# Patient Record
Sex: Female | Born: 1960 | Race: Asian | Hispanic: No | Marital: Married | Smoking: Never smoker
Health system: Southern US, Community
[De-identification: ages and names within clinical notes are randomized; demographics above are authoritative.]

## PROBLEM LIST (undated history)

## (undated) DIAGNOSIS — I10 Essential (primary) hypertension: Secondary | ICD-10-CM

## (undated) HISTORY — PX: MULTIPLE TOOTH EXTRACTIONS: SHX2053

---

## 1998-01-15 ENCOUNTER — Encounter: Admission: RE | Admit: 1998-01-15 | Discharge: 1998-01-15 | Payer: Self-pay | Admitting: Internal Medicine

## 1999-12-24 ENCOUNTER — Encounter: Admission: RE | Admit: 1999-12-24 | Discharge: 1999-12-24 | Payer: Self-pay | Admitting: Hematology and Oncology

## 2000-01-21 ENCOUNTER — Ambulatory Visit (HOSPITAL_COMMUNITY): Admission: RE | Admit: 2000-01-21 | Discharge: 2000-01-21 | Payer: Self-pay

## 2000-03-08 ENCOUNTER — Encounter: Admission: RE | Admit: 2000-03-08 | Discharge: 2000-03-08 | Payer: Self-pay | Admitting: Internal Medicine

## 2000-03-25 ENCOUNTER — Encounter: Admission: RE | Admit: 2000-03-25 | Discharge: 2000-03-25 | Payer: Self-pay | Admitting: Obstetrics

## 2000-04-01 ENCOUNTER — Encounter: Admission: RE | Admit: 2000-04-01 | Discharge: 2000-04-01 | Payer: Self-pay | Admitting: Hematology and Oncology

## 2001-08-16 ENCOUNTER — Encounter: Admission: RE | Admit: 2001-08-16 | Discharge: 2001-08-16 | Payer: Self-pay

## 2001-08-18 ENCOUNTER — Ambulatory Visit (HOSPITAL_COMMUNITY): Admission: RE | Admit: 2001-08-18 | Discharge: 2001-08-18 | Payer: Self-pay | Admitting: *Deleted

## 2001-08-30 ENCOUNTER — Encounter: Admission: RE | Admit: 2001-08-30 | Discharge: 2001-08-30 | Payer: Self-pay

## 2001-10-24 ENCOUNTER — Encounter: Admission: RE | Admit: 2001-10-24 | Discharge: 2001-10-24 | Payer: Self-pay | Admitting: Internal Medicine

## 2001-10-26 ENCOUNTER — Ambulatory Visit (HOSPITAL_COMMUNITY): Admission: RE | Admit: 2001-10-26 | Discharge: 2001-10-26 | Payer: Self-pay

## 2001-10-31 ENCOUNTER — Encounter: Admission: RE | Admit: 2001-10-31 | Discharge: 2001-10-31 | Payer: Self-pay | Admitting: Internal Medicine

## 2001-11-07 ENCOUNTER — Encounter: Admission: RE | Admit: 2001-11-07 | Discharge: 2001-11-07 | Payer: Self-pay | Admitting: Internal Medicine

## 2003-07-10 ENCOUNTER — Encounter: Admission: RE | Admit: 2003-07-10 | Discharge: 2003-07-10 | Payer: Self-pay | Admitting: Specialist

## 2003-07-10 ENCOUNTER — Encounter: Payer: Self-pay | Admitting: Specialist

## 2003-09-07 ENCOUNTER — Emergency Department (HOSPITAL_COMMUNITY): Admission: EM | Admit: 2003-09-07 | Discharge: 2003-09-07 | Payer: Self-pay | Admitting: Emergency Medicine

## 2003-09-08 ENCOUNTER — Inpatient Hospital Stay (HOSPITAL_COMMUNITY): Admission: EM | Admit: 2003-09-08 | Discharge: 2003-09-11 | Payer: Self-pay | Admitting: Emergency Medicine

## 2003-09-10 ENCOUNTER — Encounter (INDEPENDENT_AMBULATORY_CARE_PROVIDER_SITE_OTHER): Payer: Self-pay | Admitting: *Deleted

## 2003-09-18 ENCOUNTER — Encounter: Admission: RE | Admit: 2003-09-18 | Discharge: 2003-09-18 | Payer: Self-pay | Admitting: Internal Medicine

## 2004-11-14 ENCOUNTER — Emergency Department (HOSPITAL_COMMUNITY): Admission: EM | Admit: 2004-11-14 | Discharge: 2004-11-14 | Payer: Self-pay | Admitting: Family Medicine

## 2005-01-29 ENCOUNTER — Ambulatory Visit: Payer: Self-pay | Admitting: Internal Medicine

## 2005-01-29 ENCOUNTER — Ambulatory Visit (HOSPITAL_COMMUNITY): Admission: RE | Admit: 2005-01-29 | Discharge: 2005-01-29 | Payer: Self-pay | Admitting: Internal Medicine

## 2005-04-22 ENCOUNTER — Emergency Department (HOSPITAL_COMMUNITY): Admission: EM | Admit: 2005-04-22 | Discharge: 2005-04-22 | Payer: Self-pay | Admitting: Family Medicine

## 2006-12-31 ENCOUNTER — Other Ambulatory Visit: Admission: RE | Admit: 2006-12-31 | Discharge: 2006-12-31 | Payer: Self-pay | Admitting: Gynecology

## 2006-12-31 ENCOUNTER — Encounter (INDEPENDENT_AMBULATORY_CARE_PROVIDER_SITE_OTHER): Payer: Self-pay | Admitting: *Deleted

## 2006-12-31 ENCOUNTER — Ambulatory Visit: Payer: Self-pay | Admitting: *Deleted

## 2007-01-14 ENCOUNTER — Ambulatory Visit: Payer: Self-pay | Admitting: Family Medicine

## 2007-11-02 ENCOUNTER — Emergency Department (HOSPITAL_COMMUNITY): Admission: EM | Admit: 2007-11-02 | Discharge: 2007-11-02 | Payer: Self-pay | Admitting: Emergency Medicine

## 2009-07-08 ENCOUNTER — Emergency Department (HOSPITAL_COMMUNITY): Admission: EM | Admit: 2009-07-08 | Discharge: 2009-07-09 | Payer: Self-pay | Admitting: Emergency Medicine

## 2011-02-13 NOTE — Discharge Summary (Signed)
NAMEJANINA, TRAFTON                              ACCOUNT NO.:  [id number]   MEDICAL RECORD NO.:  [id number]                   PATIENT TYPE:  INP   LOCATION:  3708                                 FACILITY:  [facility]   PHYSICIAN:  Ileana Roup, M.D.               DATE OF BIRTH:  Mar 22, 1961   DATE OF ADMISSION:  09/08/2003  DATE OF DISCHARGE:  09/11/2003                                 DISCHARGE SUMMARY   DISCHARGE DIAGNOSIS:  Acute pericarditis.   DISCHARGE MEDICATIONS:  1. INH 300 mg q.d. for 9 months.  2. B6 50 mg q.d. for 9 months.  3. Ibuprofen 800 mg t.i.d. for 5 days.   HOSPITAL FOLLOW UP:  December 21 at 3:15 p.m. in the [facility] outpatient  clinic.   PROCEDURE:  On September 10, 2003, 2-D echocardiogram summary:  Overall left  ventricular function systolic function was normal. Left ventricular ejection  fraction was estimated range being 55 to 65%. There were no left ventricular  regional wall motion abnormalities. 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 There was no pericardial  effusion. On September 08, 2003, chest CT with contrast, impression:  Nonspecific mild mediastinal adenopathy, minimal atelectasis in the lingula  and right lower lobe, no evidence of pulmonary embolism. CT scan of the  lower extremities with contrast:  No evidence of deep vein thrombosis. On  September 10, 2003 chest x-ray, impression:  Borderline cardiac size, mild  left lower lobe atelectasis.   HISTORY OF PRESENT ILLNESS:  A 50 year old [place] woman with no  significant past medical history reported to the emergency department with  sudden onset of retrosternal chest pain 10/10. She reports this was worsened  by breathing or movement, causes nausea, no vomiting, no fevers. The patient  was seen in the ED and sent home on Prevacid for presumed GERD. Chest pain  continued to worsen. The patient was unable to lie back. Chest pain now  radiates to the  back. She reports this is the first episode that she has  ever had of this. Therefore, she returned to the emergency department today  for further evaluation.   ADMISSION LABORATORY DATA:  Sodium 136, potassium 3.5, chloride 104, bicarb  23, BUN 7, creatinine 0.5, glucose 184. WBC 7.8, hemoglobin 14, platelets  322. D-dimer 0.44. CK-MB less than 10, myoglobin 32, troponin less than  0.05. EKG:  Normal sinus rhythm, normal axis, possible diffuse ST changes  throughout. Chest x-ray:  Negative.   HOSPITAL COURSE:  1. Acute pericarditis. The patient was admitted on September 08, 2003 for     further evaluation of chest pain. Cardiac enzymes were cycled and were     negative x3. Two-D echocardiogram was obtained two days after admission     and did not show pericardial effusion. At the time of the echocardiogram,     symptoms  had dissipated, and it is possible that the effusion has already     resolved. Presentation and EKG findings were more convincing for     pericarditis. The patient on admission was started on Motrin 800 mg     t.i.d. to continue this as an outpatient for 5 days. ANA was negative.     HIV was nonreactive. PPD was placed which did show reaction. The patient     had no arrhythmia on telemetry.  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 The patient     did have previous vaccine in [place]. Chest x-ray does not show     active disease. The patient was sent home on INH 300 mg for 9 months and     B6 50 mg for 9 months. The patient will be followed including her liver     enzymes.   DISCHARGE LABORATORY DATA:  WBC 5.9, hemoglobin 13, hematocrit 37.8,  platelets 371. Sodium 140, potassium 3.6, chloride 106, bicarb 28, glucose  91, BUN 8, creatinine 0.7, calcium 9.0. HIV nonreactive. UA negative. ANA  negative.      Delbert Harness, MD                    Ileana Roup, M.D.    Cleotis Lema  D:  10/23/2003  T:  10/23/2003  Job:  [id number]

## 2011-06-19 LAB — POCT URINALYSIS DIP (DEVICE)
Bilirubin Urine: NEGATIVE
Glucose, UA: NEGATIVE
Ketones, ur: NEGATIVE
Nitrite: NEGATIVE
Operator id: 239701
Protein, ur: NEGATIVE
Specific Gravity, Urine: 1.015
Urobilinogen, UA: 0.2
pH: 8.5 — ABNORMAL HIGH

## 2011-06-19 LAB — WET PREP, GENITAL
Trich, Wet Prep: NONE SEEN
Yeast Wet Prep HPF POC: NONE SEEN

## 2011-06-19 LAB — POCT PREGNANCY, URINE
Operator id: 239701
Preg Test, Ur: NEGATIVE

## 2011-06-19 LAB — URINE CULTURE
Colony Count: NO GROWTH
Culture: NO GROWTH

## 2011-06-19 LAB — GC/CHLAMYDIA PROBE AMP, GENITAL
Chlamydia, DNA Probe: NEGATIVE
GC Probe Amp, Genital: NEGATIVE

## 2011-11-04 ENCOUNTER — Ambulatory Visit (INDEPENDENT_AMBULATORY_CARE_PROVIDER_SITE_OTHER): Payer: Self-pay | Admitting: Internal Medicine

## 2011-11-04 ENCOUNTER — Encounter: Payer: Self-pay | Admitting: Internal Medicine

## 2011-11-04 DIAGNOSIS — M65839 Other synovitis and tenosynovitis, unspecified forearm: Secondary | ICD-10-CM

## 2011-11-04 DIAGNOSIS — M654 Radial styloid tenosynovitis [de Quervain]: Secondary | ICD-10-CM

## 2011-11-04 DIAGNOSIS — R209 Unspecified disturbances of skin sensation: Secondary | ICD-10-CM

## 2011-11-04 DIAGNOSIS — R030 Elevated blood-pressure reading, without diagnosis of hypertension: Secondary | ICD-10-CM

## 2011-11-04 DIAGNOSIS — Z0000 Encounter for general adult medical examination without abnormal findings: Secondary | ICD-10-CM

## 2011-11-04 DIAGNOSIS — R6889 Other general symptoms and signs: Secondary | ICD-10-CM

## 2011-11-04 DIAGNOSIS — H5712 Ocular pain, left eye: Secondary | ICD-10-CM

## 2011-11-04 DIAGNOSIS — H5710 Ocular pain, unspecified eye: Secondary | ICD-10-CM

## 2011-11-04 DIAGNOSIS — Z23 Encounter for immunization: Secondary | ICD-10-CM

## 2011-11-04 MED ORDER — IBUPROFEN 200 MG PO TABS: 200.0000 mg | ORAL_TABLET | Freq: Four times a day (QID) | ORAL | Status: AC | PRN

## 2011-11-04 NOTE — Patient Instructions (Signed)
1. Please take all medications as prescribed.  2. If you have worsening of your symptoms or new symptoms arise, please call the clinic ([phone]), or go to the ER immediately if symptoms are severe. 3.  
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
## 2011-11-25 ENCOUNTER — Encounter: Payer: Self-pay | Admitting: Obstetrics & Gynecology

## 2011-12-14 ENCOUNTER — Encounter: Payer: Self-pay | Admitting: Physician Assistant

## 2011-12-25 ENCOUNTER — Encounter: Payer: Self-pay | Admitting: Obstetrics & Gynecology

## 2012-01-06 ENCOUNTER — Ambulatory Visit (INDEPENDENT_AMBULATORY_CARE_PROVIDER_SITE_OTHER): Payer: Self-pay | Admitting: Obstetrics & Gynecology

## 2012-01-06 ENCOUNTER — Encounter: Payer: Self-pay | Admitting: Obstetrics & Gynecology

## 2012-01-06 VITALS — BP 144/89 | HR 64 | Temp 97.6°F | Resp 16 | Ht 61.0 in | Wt 122.9 lb

## 2012-01-06 DIAGNOSIS — N951 Menopausal and female climacteric states: Secondary | ICD-10-CM

## 2012-01-06 DIAGNOSIS — R87619 Unspecified abnormal cytological findings in specimens from cervix uteri: Secondary | ICD-10-CM

## 2012-01-06 DIAGNOSIS — Z1231 Encounter for screening mammogram for malignant neoplasm of breast: Secondary | ICD-10-CM

## 2012-01-06 NOTE — Progress Notes (Signed)
  Subjective:    Patient ID: Michele Webb, female    DOB: 04-03-1961, 51 y.o.   MRN: [id number]  HPIPatient's last menstrual period was 12/08/2011. [id number] The patient had an abnormal Pap smear followed by a colposcopy and biopsies in 2008. She has not had a Pap since then. The biopsy showed chronic inflammation with no dysplasia. Her doctor has sent her here for a Pap smear. She notes some occasional burning sensation in her right groin near her vulva. She has no abnormal discharge. She has regular menses that last that 2 days.  History reviewed. No pertinent past medical history. Past Surgical History  Procedure Date  . Multiple tooth extractions    No current outpatient prescriptions on file. No Known Allergies History   Social History  . Marital Status: Married    Spouse Name: N/A    Number of Children: N/A  . Years of Education: N/A   Occupational History  . Not on file.   Social History Main Topics  . Smoking status: Never Smoker   . Smokeless tobacco: Not on file  . Alcohol Use: No  . Drug Use: No  . Sexually Active: No   Other Topics Concern  . Not on file   Social History Narrative  . No narrative on file   Family History  Problem Relation Age of Onset  . Heart disease Mother   . Diabetes Mother   . Hypertension Mother   . Hyperlipidemia Mother   . Early death Father     Review of Systems As noted in HPI.     Objective:   Physical Exam  Constitutional: She is oriented to person, place, and time. She appears well-developed and well-nourished.  Abdominal: Soft. She exhibits no mass. There is no tenderness.  Genitourinary: Vagina normal and uterus normal.       No abnl discharge. Pap done. No masses. Vulva, groin normal  Neurological: She is alert and oriented to person, place, and time.  Skin: Skin is warm and dry.  Psychiatric: She has a normal mood and affect. Her behavior is normal.          Assessment & Plan:  History of abnormal pa, normal  biopsy 2008. Pap done. Recommend screening mammogram. Yearly exams. Alexandre Lightsey 3:56 PM 01/06/12

## 2012-01-06 NOTE — Patient Instructions (Signed)
Mammogram A mammogram is an X-ray test to find changes in a woman's breast. You should get a mammogram if:  You are over 53 years of age.   You have risk factors.   Your doctor recommends that you have one.  BEFORE THE TEST  Do not schedule the test the week before your period, especially if your breasts are sore during this time.  On the day of your mammogram:  Wash your breasts and armpits well. After washing, do not put on any deodorant or talcum powder on until after your test.   Eat and drink as you usually do.   Take your medicines as usual.   If you are diabetic and take insulin, make sure you:   Eat before coming for your test.   Take your insulin as usual.   If you cannot keep your appointment, call before the appointment to cancel. Schedule another appointment.  TEST  You will need to undress from the waist up. You will put on a hospital gown.   Your breast will be put on the mammogram machine, and it will press firmly on your breast with a piece of plastic called a compression paddle. This will make your breast flatter so that the machine can X-ray all parts of your breast.   Both breasts will be X-rayed. Each breast will be X-rayed from above and from the side. An X-ray might need to be taken again if the picture is not good enough.   The mammogram will last about 15 to 30 minutes.  AFTER THE TEST Finding out the results of your test Ask when your test results will be ready. Make sure you get your test results. Document Released: 12/11/2008 Document Revised: 09/03/2011 Document Reviewed: 12/11/2008 Exit[employer] Patient Information 2012 [employer]. 

## 2012-01-06 NOTE — Progress Notes (Signed)
Pt reports pain and burning in Rt groin x2 mos. She also had abnormal Pap in 2008, no Pap since then

## 2012-01-15 ENCOUNTER — Ambulatory Visit (HOSPITAL_COMMUNITY): Admission: RE | Admit: 2012-01-15 | Payer: Self-pay | Source: Ambulatory Visit

## 2012-02-08 ENCOUNTER — Ambulatory Visit (HOSPITAL_COMMUNITY): Payer: Self-pay

## 2012-03-03 ENCOUNTER — Ambulatory Visit (HOSPITAL_COMMUNITY)
Admission: RE | Admit: 2012-03-03 | Discharge: 2012-03-03 | Disposition: A | Discharge status: Home / Self Care | Payer: Self-pay | Source: Ambulatory Visit | Attending: Obstetrics & Gynecology | Admitting: Obstetrics & Gynecology

## 2012-03-03 DIAGNOSIS — Z1231 Encounter for screening mammogram for malignant neoplasm of breast: Secondary | ICD-10-CM | POA: Insufficient documentation

## 2012-04-21 ENCOUNTER — Encounter: Payer: Self-pay | Admitting: Internal Medicine

## 2012-04-26 ENCOUNTER — Encounter (HOSPITAL_COMMUNITY): Payer: Self-pay | Admitting: *Deleted

## 2012-04-26 ENCOUNTER — Emergency Department (HOSPITAL_COMMUNITY)
Admission: EM | Admit: 2012-04-26 | Discharge: 2012-04-26 | Disposition: A | Discharge status: Home Health | Payer: No Typology Code available for payment source | Attending: Emergency Medicine | Admitting: Emergency Medicine

## 2012-04-26 DIAGNOSIS — Y92410 Unspecified street and highway as the place of occurrence of the external cause: Secondary | ICD-10-CM | POA: Insufficient documentation

## 2012-04-26 DIAGNOSIS — T148XXA Other injury of unspecified body region, initial encounter: Secondary | ICD-10-CM

## 2012-04-26 DIAGNOSIS — IMO0002 Reserved for concepts with insufficient information to code with codable children: Secondary | ICD-10-CM | POA: Insufficient documentation

## 2012-04-26 DIAGNOSIS — Y998 Other external cause status: Secondary | ICD-10-CM | POA: Insufficient documentation

## 2012-04-26 DIAGNOSIS — Y93I9 Activity, other involving external motion: Secondary | ICD-10-CM | POA: Insufficient documentation

## 2012-04-26 MED ORDER — IBUPROFEN 400 MG PO TABS: 400.0000 mg | ORAL_TABLET | Freq: Once | ORAL | Status: DC

## 2012-04-26 MED ORDER — DIAZEPAM 5 MG PO TABS: 5.0000 mg | ORAL_TABLET | Freq: Three times a day (TID) | ORAL | Status: AC | PRN

## 2012-04-26 MED ORDER — IBUPROFEN 400 MG PO TABS
ORAL_TABLET | ORAL | Status: AC
  Filled 2012-04-26: qty 1

## 2012-04-26 MED ORDER — OXYCODONE-ACETAMINOPHEN 5-325 MG PO TABS
ORAL_TABLET | ORAL | Status: AC
  Filled 2012-04-26: qty 2

## 2012-04-26 MED ORDER — OXYCODONE-ACETAMINOPHEN 5-325 MG PO TABS
2.0000 | ORAL_TABLET | Freq: Once | ORAL | Status: AC
  Administered 2012-04-26: 2 via ORAL

## 2012-04-26 MED ORDER — OXYCODONE-ACETAMINOPHEN 5-325 MG PO TABS: 1.0000 | ORAL_TABLET | ORAL | Status: AC | PRN

## 2012-04-26 MED ORDER — NAPROXEN 500 MG PO TABS: 500.0000 mg | ORAL_TABLET | Freq: Two times a day (BID) | ORAL | Status: AC
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
## 2012-07-25 ENCOUNTER — Telehealth: Payer: Self-pay | Admitting: *Deleted

## 2012-07-25 NOTE — Telephone Encounter (Signed)
Pt called - her dentist Dr [place] Callas wants to talk to Dr Clyde Lundborg if she needs antibiotics for dental work due to hx of infection around heart. Phone number for dentist [phone]. Dental practice aware Dr Clyde Lundborg will be in clinic 07/26/12 PM.  Stanton Kidney Tharon Bomar RN 07/25/12 9:50AM

## 2012-08-09 ENCOUNTER — Ambulatory Visit (INDEPENDENT_AMBULATORY_CARE_PROVIDER_SITE_OTHER): Payer: Self-pay | Admitting: Internal Medicine

## 2012-08-09 ENCOUNTER — Encounter: Payer: Self-pay | Admitting: Internal Medicine

## 2012-08-09 VITALS — BP 140/80 | HR 66 | Temp 98.1°F | Ht 62.0 in | Wt 124.4 lb

## 2012-08-09 DIAGNOSIS — Z0000 Encounter for general adult medical examination without abnormal findings: Secondary | ICD-10-CM | POA: Insufficient documentation

## 2012-08-09 DIAGNOSIS — I10 Essential (primary) hypertension: Secondary | ICD-10-CM | POA: Insufficient documentation

## 2012-08-09 DIAGNOSIS — R42 Dizziness and giddiness: Secondary | ICD-10-CM | POA: Insufficient documentation

## 2012-08-09 MED ORDER — HYDROCHLOROTHIAZIDE 12.5 MG PO CAPS: 12.5000 mg | ORAL_CAPSULE | Freq: Every day | ORAL | Status: DC

## 2012-08-09 NOTE — Assessment & Plan Note (Signed)
Patient's blood pressure is slightly elevated recently. Today blood pressure is 153/95. She does not have chest pain, palpitation or leg edema. Will start treating patient with a low dose of hydrochlorothiazide, 12.5 mg daily. We'll reevaluate patient in one month.

## 2012-08-09 NOTE — Progress Notes (Signed)
Patient ID: Michele Webb, female   DOB: [birth year], 51 y.o.   MRN: [id number]  Subjective:   Patient ID: Michele Webb female   DOB: 1960/12/23 51 y.o.   MRN: [id number]  HPI: [name] is a 51 y.o. with past medical history as outlined below, who presents for an acute visit.  Patient reports that she has had mild dizziness for 2 weeks. She denies weakness or numbness in her extremities. There is no headache, chest pain or palpitation. She feels that "brain is not clear". She does not feel like the room is spinning around her. There is no ringing in her ears. Head position change does not worsen the dizziness. She has not had a fall. She does not lose her balance. Detailed questioning revealed that she has been sleeping together with her 64-year-old grandson recently. She could not have good sleep in the night. Most of the time she could only sleep 4 to 6 hours per night. She feels tired in the morning. There is no fever or chills  Patient is also concerned that she still has regular periods at age of 53. She said her sister stopped having periods at a year of 77. She reports that her period is regular with a normal volume. There is no weight loss. I explained to her that it is most likely normal variation. Patient was instructed to come back to the hospital if her periods become irregular or loose large amount of blood.   No past medical history on file. Current Outpatient Prescriptions  Medication Sig Dispense Refill  . hydrochlorothiazide (MICROZIDE) 12.5 MG capsule Take 1 capsule (12.5 mg total) by mouth daily.  30 capsule  3  . naproxen (NAPROSYN) 500 MG tablet Take 1 tablet (500 mg total) by mouth 2 (two) times daily.  30 tablet  0   Family History  Problem Relation Age of Onset  . Heart disease Mother   . Diabetes Mother   . Hypertension Mother   . Hyperlipidemia Mother   . Early death Father    History   Social History  . Marital Status: Married    Spouse Name: N/A    Number of  Children: N/A  . Years of Education: N/A   Social History Main Topics  . Smoking status: Never Smoker   . Smokeless tobacco: Not on file  . Alcohol Use: No  . Drug Use: No  . Sexually Active: No   Other Topics Concern  . Not on file   Social History Narrative  . No narrative on file   Review of Systems: Constitutional: Denies fever, chills, diaphoresis, appetite change and fatigue.  HEENT: Denies photophobia, eye pain, redness, hearing loss, ear pain, congestion, sore throat, rhinorrhea, sneezing, mouth sores, trouble swallowing, neck pain, neck stiffness and tinnitus.   Respiratory: Denies SOB, DOE, cough, chest tightness,  and wheezing.   Cardiovascular: Denies chest pain, palpitations and leg swelling.  Gastrointestinal: Denies nausea, vomiting, abdominal pain, diarrhea, constipation, blood in stool and abdominal distention.  Genitourinary: Denies dysuria, urgency, frequency, hematuria, flank pain and difficulty urinating.  Musculoskeletal: Denies myalgias, back pain, joint swelling, arthralgias and gait problem.  Skin: Denies pallor, rash and wound.  Neurological: Denies dizziness, seizures, syncope, weakness, light-headedness, numbness and headaches.  Hematological: Denies adenopathy. Easy bruising, personal or family bleeding history  Psychiatric/Behavioral: Denies suicidal ideation, mood changes, confusion, nervousness, sleep disturbance and agitation  Objective:  Physical Exam: Filed Vitals:   08/09/12 1625 08/09/12 1626 08/09/12 1627  BP: 153/95 120/80 140/80  Pulse: 66 66 66  Temp: 98.1 F (36.7 C)    TempSrc: Oral    Height: 5\' 2"  (1.575 m)    Weight: 124 lb 6.4 oz (56.427 kg)    SpO2: 100%     Constitutional: Vital signs reviewed.  Head: Normocephalic and atraumatic Ear: TM normal bilaterally Mouth: no erythema or exudates, MMM Eyes: PERRL, EOMI, conjunctivae normal, No scleral icterus.  Neck: Supple, Trachea midline normal ROM, No JVD, mass, thyromegaly, or  carotid bruit present.  Cardiovascular: RRR, S1 normal, S2 normal, no MRG, pulses symmetric and intact bilaterally Pulmonary/Chest: CTAB, no wheezes, rales, or rhonchi Abdominal: Soft. Non-tender, non-distended, bowel sounds are normal, no masses, organomegaly, or guarding present.  GU: no CVA tenderness Musculoskeletal: No joint deformities, erythema, or stiffness, ROM full. Hematology: no cervical, inginal, or axillary adenopathy.  Neurological: A&O x3, Strength is normal and symmetric bilaterally, cranial nerve II-XII are grossly intact, no focal motor deficit, sensory intact to light touch bilaterally.  Skin: Warm, dry and intact. No rash, cyanosis, or clubbing.  Psychiatric: Normal mood and affect. speech and behavior is normal. Judgment and thought content normal. Cognition and memory are normal.   Assessment & Plan:

## 2012-08-09 NOTE — Assessment & Plan Note (Signed)
Patient's dizziness is most likely due to sleep deprivation. Her neurologic examination is completely normal, it is unlikely that patient has serious problem, such as TIA or stroke. It is also less likely that patient has inner ear problem given no typical vestibular symptoms. Patient was advised to not sleep with her grandson together. She was instructed to come back to the hospital if her symptoms get worse.

## 2012-08-09 NOTE — Assessment & Plan Note (Signed)
-  Patient's mammogram and Pap smear are up-to-date -Patient refused flu shot today. -She would like to consider colonoscopy in next visit.

## 2012-08-09 NOTE — Patient Instructions (Signed)
1. your blood pressure is elevated, this means that you have mild hypertension. Please start taking hydrochlorothiazide from now. 2. Regarding her dizziness, I think is most likely because of your sleep deprivation. Please do not sleep together with your grandson from now. If your symptoms get worse please come back. 3. If you have worsening of your symptoms or new symptoms arise, please call the clinic ([phone]), or go to the ER immediately if symptoms are severe.    Hypertension As your heart beats, it forces blood through your arteries. This force is your blood pressure. If the pressure is too high, it is called hypertension (HTN) or high blood pressure. HTN is dangerous because you may have it and not know it. High blood pressure may mean that your heart has to work harder to pump blood. Your arteries may be narrow or stiff. The extra work puts you at risk for heart disease, stroke, and other problems.  Blood pressure consists of two numbers, a higher number over a lower, 110/72, for example. It is stated as "110 over 72." The ideal is below 120 for the top number (systolic) and under 80 for the bottom (diastolic). Write down your blood pressure today. You should pay close attention to your blood pressure if you have certain conditions such as:  Heart failure.  Prior heart attack.  Diabetes  Chronic kidney disease.  Prior stroke.  Multiple risk factors for heart disease. To see if you have HTN, your blood pressure should be measured while you are seated with your arm held at the level of the heart. It should be measured at least twice. A one-time elevated blood pressure reading (especially in the Emergency Department) does not mean that you need treatment. There may be conditions in which the blood pressure is different between your right and left arms. It is important to see your caregiver soon for a recheck. Most people have essential hypertension which means that there is not a  specific cause. This type of high blood pressure may be lowered by changing lifestyle factors such as:  Stress.  Smoking.  Lack of exercise.  Excessive weight.  Drug/tobacco/alcohol use.  Eating less salt. Most people do not have symptoms from high blood pressure until it has caused damage to the body. Effective treatment can often prevent, delay or reduce that damage. TREATMENT  When a cause has been identified, treatment for high blood pressure is directed at the cause. There are a large number of medications to treat HTN. These fall into several categories, and your caregiver will help you select the medicines that are best for you. Medications may have side effects. You should review side effects with your caregiver. If your blood pressure stays high after you have made lifestyle changes or started on medicines,   Your medication(s) may need to be changed.  Other problems may need to be addressed.  Be certain you understand your prescriptions, and know how and when to take your medicine.  Be sure to follow up with your caregiver within the time frame advised (usually within two weeks) to have your blood pressure rechecked and to review your medications.  If you are taking more than one medicine to lower your blood pressure, make sure you know how and at what times they should be taken. Taking two medicines at the same time can result in blood pressure that is too low. SEEK IMMEDIATE MEDICAL CARE IF:  You develop a severe headache, blurred or changing vision, or confusion.  You have unusual weakness or numbness, or a faint feeling.  You have severe chest or abdominal pain, vomiting, or breathing problems. MAKE SURE YOU:   Understand these instructions.  Will watch your condition.  Will get help right away if you are not doing well or get worse. Document Released: 09/14/2005 Document Revised: 12/07/2011 Document Reviewed: 05/04/2008 [facility] Patient Information 2013  [place], [place].

## 2013-05-23 ENCOUNTER — Encounter: Payer: Self-pay | Admitting: Internal Medicine

## 2013-10-25 ENCOUNTER — Encounter: Payer: Self-pay | Admitting: Internal Medicine

## 2014-07-30 ENCOUNTER — Encounter: Payer: Self-pay | Admitting: Internal Medicine

## 2015-04-20 ENCOUNTER — Encounter (HOSPITAL_COMMUNITY): Payer: Self-pay | Admitting: *Deleted

## 2015-04-20 DIAGNOSIS — R17 Unspecified jaundice: Secondary | ICD-10-CM | POA: Insufficient documentation

## 2015-04-20 DIAGNOSIS — K3580 Unspecified acute appendicitis: Principal | ICD-10-CM | POA: Insufficient documentation

## 2015-04-20 DIAGNOSIS — I10 Essential (primary) hypertension: Secondary | ICD-10-CM | POA: Insufficient documentation

## 2015-04-20 DIAGNOSIS — K8020 Calculus of gallbladder without cholecystitis without obstruction: Secondary | ICD-10-CM | POA: Insufficient documentation

## 2015-04-20 LAB — COMPREHENSIVE METABOLIC PANEL
ALT: 17 U/L (ref 14–54)
AST: 22 U/L (ref 15–41)
Albumin: 4.4 g/dL (ref 3.5–5.0)
Alkaline Phosphatase: 72 U/L (ref 38–126)
Anion gap: 11 (ref 5–15)
BUN: 12 mg/dL (ref 6–20)
CO2: 24 mmol/L (ref 22–32)
Calcium: 9.4 mg/dL (ref 8.9–10.3)
Chloride: 101 mmol/L (ref 101–111)
Creatinine, Ser: 0.6 mg/dL (ref 0.44–1.00)
GFR calc Af Amer: >60 mL/min (ref >60)
GFR calc non Af Amer: >60 mL/min (ref >60)
Glucose, Bld: 125 mg/dL — ABNORMAL HIGH (ref 65–99)
Potassium: 3.7 mmol/L (ref 3.5–5.1)
Sodium: 136 mmol/L (ref 135–145)
Total Bilirubin: 1.3 mg/dL — ABNORMAL HIGH (ref 0.3–1.2)
Total Protein: 7.7 g/dL (ref 6.5–8.1)

## 2015-04-20 LAB — URINALYSIS, ROUTINE W REFLEX MICROSCOPIC
Bilirubin Urine: NEGATIVE
Glucose, UA: NEGATIVE mg/dL
Ketones, ur: 40 mg/dL — AB
Nitrite: NEGATIVE
Protein, ur: NEGATIVE mg/dL
Specific Gravity, Urine: 1.024 (ref 1.005–1.030)
Urobilinogen, UA: 0.2 mg/dL (ref 0.0–1.0)
pH: 6 (ref 5.0–8.0)

## 2015-04-20 LAB — CBC
HCT: 39 % (ref 36.0–46.0)
Hemoglobin: 13.2 g/dL (ref 12.0–15.0)
MCH: 30.3 pg (ref 26.0–34.0)
MCHC: 33.8 g/dL (ref 30.0–36.0)
MCV: 89.4 fL (ref 78.0–100.0)
Platelets: 276 10*3/uL (ref 150–400)
RBC: 4.36 MIL/uL (ref 3.87–5.11)
RDW: 12.6 % (ref 11.5–15.5)
WBC: 6.1 10*3/uL (ref 4.0–10.5)

## 2015-04-20 LAB — LIPASE, BLOOD: Lipase: 20 U/L — ABNORMAL LOW (ref 22–51)

## 2015-04-20 LAB — URINE MICROSCOPIC-ADD ON

## 2015-04-20 MED ORDER — OXYCODONE-ACETAMINOPHEN 5-325 MG PO TABS
1.0000 | ORAL_TABLET | Freq: Once | ORAL | Status: AC
  Administered 2015-04-20: 1 via ORAL
  Filled 2015-04-20: qty 1

## 2015-04-20 MED ORDER — ONDANSETRON 4 MG PO TBDP
8.0000 mg | ORAL_TABLET | Freq: Once | ORAL | Status: AC
  Administered 2015-04-20: 8 mg via ORAL
  Filled 2015-04-20: qty 2

## 2015-04-20 NOTE — ED Notes (Signed)
The pts pain is increasing

## 2015-04-20 NOTE — ED Notes (Signed)
The pt is c/o abd pain since this am with nausea. lmp none

## 2015-04-21 ENCOUNTER — Observation Stay (HOSPITAL_COMMUNITY): Payer: Self-pay | Admitting: Anesthesiology

## 2015-04-21 ENCOUNTER — Observation Stay (HOSPITAL_COMMUNITY)
Admission: EM | Admit: 2015-04-21 | Discharge: 2015-04-22 | Disposition: A | Discharge status: Home / Self Care | Payer: Self-pay | Attending: General Surgery | Admitting: General Surgery

## 2015-04-21 ENCOUNTER — Encounter (HOSPITAL_COMMUNITY)
Admission: EM | Disposition: A | Discharge status: Home / Self Care | Payer: Self-pay | Source: Home / Self Care | Attending: Emergency Medicine

## 2015-04-21 ENCOUNTER — Encounter (HOSPITAL_COMMUNITY): Payer: Self-pay | Admitting: Radiology

## 2015-04-21 ENCOUNTER — Emergency Department (HOSPITAL_COMMUNITY): Payer: Self-pay

## 2015-04-21 DIAGNOSIS — K37 Unspecified appendicitis: Secondary | ICD-10-CM | POA: Diagnosis present

## 2015-04-21 HISTORY — PX: LAPAROSCOPIC APPENDECTOMY: SHX408

## 2015-04-21 HISTORY — DX: Essential (primary) hypertension: I10

## 2015-04-21 LAB — SURGICAL PCR SCREEN
MRSA, PCR: NEGATIVE
STAPHYLOCOCCUS AUREUS: NEGATIVE

## 2015-04-21 SURGERY — APPENDECTOMY, LAPAROSCOPIC
Anesthesia: General

## 2015-04-21 MED ORDER — MIDAZOLAM HCL 5 MG/5ML IJ SOLN
INTRAMUSCULAR | Status: DC | PRN
  Administered 2015-04-21: 2 mg via INTRAVENOUS

## 2015-04-21 MED ORDER — NEOSTIGMINE METHYLSULFATE 10 MG/10ML IV SOLN
INTRAVENOUS | Status: AC
  Filled 2015-04-21: qty 1

## 2015-04-21 MED ORDER — SODIUM CHLORIDE 0.9 % IR SOLN
Status: DC | PRN
  Administered 2015-04-21: 1000 mL

## 2015-04-21 MED ORDER — PROPOFOL 10 MG/ML IV BOLUS
INTRAVENOUS | Status: DC | PRN
  Administered 2015-04-21: 140 mg via INTRAVENOUS

## 2015-04-21 MED ORDER — BUPIVACAINE-EPINEPHRINE (PF) 0.25% -1:200000 IJ SOLN
INTRAMUSCULAR | Status: AC
  Filled 2015-04-21: qty 30

## 2015-04-21 MED ORDER — ALBUMIN HUMAN 5 % IV SOLN
INTRAVENOUS | Status: DC | PRN
  Administered 2015-04-21: 12:00:00 via INTRAVENOUS

## 2015-04-21 MED ORDER — LIDOCAINE HCL (CARDIAC) 20 MG/ML IV SOLN
INTRAVENOUS | Status: DC | PRN
  Administered 2015-04-21: 70 mg via INTRAVENOUS

## 2015-04-21 MED ORDER — FENTANYL CITRATE (PF) 250 MCG/5ML IJ SOLN
INTRAMUSCULAR | Status: AC
  Filled 2015-04-21: qty 5

## 2015-04-21 MED ORDER — OXYCODONE-ACETAMINOPHEN 5-325 MG PO TABS
1.0000 | ORAL_TABLET | ORAL | Status: DC | PRN
  Administered 2015-04-22: 2 via ORAL
  Filled 2015-04-21: qty 2

## 2015-04-21 MED ORDER — FENTANYL CITRATE (PF) 100 MCG/2ML IJ SOLN
INTRAMUSCULAR | Status: DC | PRN
  Administered 2015-04-21 (×2): 50 ug via INTRAVENOUS
  Administered 2015-04-21: 100 ug via INTRAVENOUS
  Administered 2015-04-21: 50 ug via INTRAVENOUS

## 2015-04-21 MED ORDER — ONDANSETRON HCL 4 MG/2ML IJ SOLN
4.0000 mg | Freq: Once | INTRAMUSCULAR | Status: AC
  Administered 2015-04-21: 4 mg via INTRAVENOUS
  Filled 2015-04-21: qty 2

## 2015-04-21 MED ORDER — MORPHINE SULFATE 4 MG/ML IJ SOLN
4.0000 mg | Freq: Once | INTRAMUSCULAR | Status: AC
  Administered 2015-04-21: 4 mg via INTRAVENOUS
  Filled 2015-04-21: qty 1

## 2015-04-21 MED ORDER — DEXTROSE 5 % IV SOLN
1.0000 g | Freq: Once | INTRAVENOUS | Status: AC
  Administered 2015-04-21: 1 g via INTRAVENOUS
  Filled 2015-04-21: qty 10

## 2015-04-21 MED ORDER — DEXAMETHASONE SODIUM PHOSPHATE 4 MG/ML IJ SOLN
INTRAMUSCULAR | Status: DC | PRN
  Administered 2015-04-21: 4 mg via INTRAVENOUS

## 2015-04-21 MED ORDER — SODIUM CHLORIDE 0.9 % IV SOLN
INTRAVENOUS | Status: DC
  Administered 2015-04-21: 03:00:00 via INTRAVENOUS

## 2015-04-21 MED ORDER — LIDOCAINE HCL (CARDIAC) 20 MG/ML IV SOLN
INTRAVENOUS | Status: AC
  Filled 2015-04-21: qty 15

## 2015-04-21 MED ORDER — HYDROMORPHONE HCL 1 MG/ML IJ SOLN
1.0000 mg | INTRAMUSCULAR | Status: DC | PRN
  Administered 2015-04-21: 1 mg via INTRAVENOUS
  Filled 2015-04-21: qty 1

## 2015-04-21 MED ORDER — NEOSTIGMINE METHYLSULFATE 10 MG/10ML IV SOLN
INTRAVENOUS | Status: DC | PRN
  Administered 2015-04-21: 5 mg via INTRAVENOUS

## 2015-04-21 MED ORDER — 0.9 % SODIUM CHLORIDE (POUR BTL) OPTIME
TOPICAL | Status: DC | PRN
  Administered 2015-04-21: 1000 mL

## 2015-04-21 MED ORDER — IOHEXOL 300 MG/ML  SOLN
25.0000 mL | Freq: Once | INTRAMUSCULAR | Status: AC | PRN
  Administered 2015-04-21: 25 mL via ORAL

## 2015-04-21 MED ORDER — ONDANSETRON HCL 4 MG/2ML IJ SOLN: 4.0000 mg | Freq: Four times a day (QID) | INTRAMUSCULAR | Status: DC | PRN

## 2015-04-21 MED ORDER — MIDAZOLAM HCL 2 MG/2ML IJ SOLN
INTRAMUSCULAR | Status: AC
  Filled 2015-04-21: qty 2

## 2015-04-21 MED ORDER — ONDANSETRON HCL 4 MG/2ML IJ SOLN
INTRAMUSCULAR | Status: AC
  Filled 2015-04-21: qty 2

## 2015-04-21 MED ORDER — LACTATED RINGERS IV SOLN
INTRAVENOUS | Status: DC | PRN
  Administered 2015-04-21 (×2): via INTRAVENOUS

## 2015-04-21 MED ORDER — ONDANSETRON HCL 4 MG/2ML IJ SOLN
INTRAMUSCULAR | Status: DC | PRN
  Administered 2015-04-21: 4 mg via INTRAVENOUS

## 2015-04-21 MED ORDER — PROPOFOL 10 MG/ML IV BOLUS
INTRAVENOUS | Status: AC
  Filled 2015-04-21: qty 20

## 2015-04-21 MED ORDER — ROCURONIUM BROMIDE 50 MG/5ML IV SOLN
INTRAVENOUS | Status: AC
  Filled 2015-04-21: qty 1

## 2015-04-21 MED ORDER — SUCCINYLCHOLINE CHLORIDE 20 MG/ML IJ SOLN
INTRAMUSCULAR | Status: AC
  Filled 2015-04-21: qty 1

## 2015-04-21 MED ORDER — HYDROMORPHONE HCL 1 MG/ML IJ SOLN: 0.2500 mg | INTRAMUSCULAR | Status: DC | PRN

## 2015-04-21 MED ORDER — SUCCINYLCHOLINE CHLORIDE 20 MG/ML IJ SOLN
INTRAMUSCULAR | Status: DC | PRN
  Administered 2015-04-21: 120 mg via INTRAVENOUS

## 2015-04-21 MED ORDER — SODIUM CHLORIDE 0.9 % IV SOLN: INTRAVENOUS | Status: DC

## 2015-04-21 MED ORDER — PROMETHAZINE HCL 25 MG/ML IJ SOLN: 6.2500 mg | INTRAMUSCULAR | Status: DC | PRN

## 2015-04-21 MED ORDER — ENOXAPARIN SODIUM 40 MG/0.4ML ~~LOC~~ SOLN: 40.0000 mg | Freq: Every day | SUBCUTANEOUS | Status: DC

## 2015-04-21 MED ORDER — GLYCOPYRROLATE 0.2 MG/ML IJ SOLN
INTRAMUSCULAR | Status: DC | PRN
  Administered 2015-04-21: 0.6 mg via INTRAVENOUS

## 2015-04-21 MED ORDER — BUPIVACAINE-EPINEPHRINE (PF) 0.5% -1:200000 IJ SOLN
INTRAMUSCULAR | Status: AC
  Filled 2015-04-21: qty 30

## 2015-04-21 MED ORDER — DEXAMETHASONE SODIUM PHOSPHATE 4 MG/ML IJ SOLN
INTRAMUSCULAR | Status: AC
  Filled 2015-04-21: qty 1

## 2015-04-21 MED ORDER — POTASSIUM CHLORIDE IN NACL 20-0.9 MEQ/L-% IV SOLN
INTRAVENOUS | Status: DC
Start: 2015-04-21 — End: 2015-04-22
  Administered 2015-04-21 – 2015-04-22 (×3): via INTRAVENOUS
  Filled 2015-04-21 (×3): qty 1000

## 2015-04-21 MED ORDER — BUPIVACAINE-EPINEPHRINE 0.25% -1:200000 IJ SOLN
INTRAMUSCULAR | Status: DC | PRN
  Administered 2015-04-21: 10 mL

## 2015-04-21 MED ORDER — IOHEXOL 300 MG/ML  SOLN
100.0000 mL | Freq: Once | INTRAMUSCULAR | Status: AC | PRN
  Administered 2015-04-21: 90 mL via INTRAVENOUS

## 2015-04-21 MED ORDER — GLYCOPYRROLATE 0.2 MG/ML IJ SOLN
INTRAMUSCULAR | Status: AC
  Filled 2015-04-21: qty 3

## 2015-04-21 MED ORDER — ARTIFICIAL TEARS OP OINT
TOPICAL_OINTMENT | OPHTHALMIC | Status: DC | PRN
  Administered 2015-04-21: 1 via OPHTHALMIC

## 2015-04-21 MED ORDER — ROCURONIUM BROMIDE 100 MG/10ML IV SOLN
INTRAVENOUS | Status: DC | PRN
  Administered 2015-04-21: 25 mg via INTRAVENOUS

## 2015-04-21 MED ORDER — ARTIFICIAL TEARS OP OINT
TOPICAL_OINTMENT | OPHTHALMIC | Status: AC
  Filled 2015-04-21: qty 3.5

## 2015-04-21 MED ORDER — METRONIDAZOLE IN NACL 5-0.79 MG/ML-% IV SOLN
500.0000 mg | Freq: Once | INTRAVENOUS | Status: AC
  Administered 2015-04-21: 500 mg via INTRAVENOUS
  Filled 2015-04-21: qty 100

## 2015-04-21 SURGICAL SUPPLY — 40 items
APPLIER CLIP ROT 10 11.4 M/L (STAPLE)
BENZOIN TINCTURE PRP APPL 2/3 (GAUZE/BANDAGES/DRESSINGS) ×3 IMPLANT
BLADE SURG ROTATE 9660 (MISCELLANEOUS) IMPLANT
CANISTER SUCTION 2500CC (MISCELLANEOUS) ×3 IMPLANT
CHLORAPREP W/TINT 26ML (MISCELLANEOUS) ×3 IMPLANT
CLIP APPLIE ROT 10 11.4 M/L (STAPLE) IMPLANT
CLOSURE WOUND 1/2 X4 (GAUZE/BANDAGES/DRESSINGS) ×1
COVER SURGICAL LIGHT HANDLE (MISCELLANEOUS) ×3 IMPLANT
CUTTER FLEX LINEAR 45M (STAPLE) ×3 IMPLANT
DRSG TEGADERM 2-3/8X2-3/4 SM (GAUZE/BANDAGES/DRESSINGS) ×6 IMPLANT
DRSG TEGADERM 4X4.75 (GAUZE/BANDAGES/DRESSINGS) ×3 IMPLANT
ELECT REM PT RETURN 9FT ADLT (ELECTROSURGICAL) ×3
ELECTRODE REM PT RTRN 9FT ADLT (ELECTROSURGICAL) ×1 IMPLANT
ENDOLOOP SUT PDS II  0 18 (SUTURE)
ENDOLOOP SUT PDS II 0 18 (SUTURE) IMPLANT
FILTER SMOKE EVAC LAPAROSHD (FILTER) ×3 IMPLANT
GAUZE SPONGE 2X2 8PLY STRL LF (GAUZE/BANDAGES/DRESSINGS) ×1 IMPLANT
GLOVE BIO SURGEON STRL SZ7 (GLOVE) ×3 IMPLANT
GLOVE BIOGEL PI IND STRL 7.5 (GLOVE) ×1 IMPLANT
GLOVE BIOGEL PI INDICATOR 7.5 (GLOVE) ×2
GOWN STRL REUS W/ TWL LRG LVL3 (GOWN DISPOSABLE) ×3 IMPLANT
GOWN STRL REUS W/TWL LRG LVL3 (GOWN DISPOSABLE) ×6
KIT BASIN OR (CUSTOM PROCEDURE TRAY) ×3 IMPLANT
KIT ROOM TURNOVER OR (KITS) ×3 IMPLANT
NS IRRIG 1000ML POUR BTL (IV SOLUTION) ×3 IMPLANT
PAD ARMBOARD 7.5X6 YLW CONV (MISCELLANEOUS) ×6 IMPLANT
POUCH SPECIMEN RETRIEVAL 10MM (ENDOMECHANICALS) ×3 IMPLANT
RELOAD STAPLE TA45 3.5 REG BLU (ENDOMECHANICALS) ×6 IMPLANT
SCALPEL HARMONIC ACE (MISCELLANEOUS) ×3 IMPLANT
SET IRRIG TUBING LAPAROSCOPIC (IRRIGATION / IRRIGATOR) ×3 IMPLANT
SPECIMEN JAR SMALL (MISCELLANEOUS) ×3 IMPLANT
SPONGE GAUZE 2X2 STER 10/PKG (GAUZE/BANDAGES/DRESSINGS) ×2
STRIP CLOSURE SKIN 1/2X4 (GAUZE/BANDAGES/DRESSINGS) ×2 IMPLANT
SUT MNCRL AB 4-0 PS2 18 (SUTURE) ×3 IMPLANT
TOWEL OR 17X24 6PK STRL BLUE (TOWEL DISPOSABLE) ×3 IMPLANT
TOWEL OR 17X26 10 PK STRL BLUE (TOWEL DISPOSABLE) ×3 IMPLANT
TRAY LAPAROSCOPIC MC (CUSTOM PROCEDURE TRAY) ×3 IMPLANT
TROCAR XCEL BLADELESS 5X75MML (TROCAR) ×6 IMPLANT
TROCAR XCEL BLUNT TIP 100MML (ENDOMECHANICALS) ×3 IMPLANT
TUBING INSUFFLATION (TUBING) ×3 IMPLANT

## 2015-04-21 NOTE — Anesthesia Preprocedure Evaluation (Addendum)
Anesthesia Evaluation  Patient identified by MRN, date of birth, ID band Patient awake    Reviewed: Allergy & Precautions, NPO status , Patient's Chart, lab work & pertinent test results  History of Anesthesia Complications Negative for: history of anesthetic complications  Airway Mallampati: II  TM Distance: >3 FB Neck ROM: Full    Dental  (+) Teeth Intact, Dental Advisory Given   Pulmonary neg pulmonary ROS,    Pulmonary exam normal       Cardiovascular hypertension, Normal cardiovascular exam    Neuro/Psych negative neurological ROS  negative psych ROS   GI/Hepatic Neg liver ROS,   Endo/Other  negative endocrine ROS  Renal/GU negative Renal ROS     Musculoskeletal   Abdominal   Peds  Hematology negative hematology ROS (+)   Anesthesia Other Findings   Reproductive/Obstetrics                            Anesthesia Physical Anesthesia Plan  ASA: II and emergent  Anesthesia Plan: General   Post-op Pain Management:    Induction: Intravenous, Rapid sequence and Cricoid pressure planned  Airway Management Planned: Oral ETT  Additional Equipment:   Intra-op Plan:   Post-operative Plan: Extubation in OR  Informed Consent: I have reviewed the patients History and Physical, chart, labs and discussed the procedure including the risks, benefits and alternatives for the proposed anesthesia with the patient or authorized representative who has indicated his/her understanding and acceptance.   Dental advisory given  Plan Discussed with: CRNA, Anesthesiologist and Surgeon  Anesthesia Plan Comments:        Anesthesia Quick Evaluation

## 2015-04-21 NOTE — Anesthesia Postprocedure Evaluation (Signed)
Anesthesia Post Note  Patient: Michele Webb  Procedure(s) Performed: Procedure(s) (LRB): APPENDECTOMY LAPAROSCOPIC (N/A)  Anesthesia type: general  Patient location: PACU  Post pain: Pain level controlled  Post assessment: Patient's Cardiovascular Status Stable  Last Vitals:  Filed Vitals:   04/21/15 1326  BP: 112/76  Pulse: 52  Temp: 36.6 C  Resp: 16    Post vital signs: Reviewed and stable  Level of consciousness: sedated  Complications: No apparent anesthesia complications

## 2015-04-21 NOTE — Progress Notes (Signed)
Report called to Misty Stanley, Charity fundraiser. Patient has left for surgery.

## 2015-04-21 NOTE — H&P (Signed)
Michele Webb is an 54 y.o. female.   Chief Complaint: Right lower quadrant abdominal pain HPI: This patient speaks little [place]. She is a coming in by her son. I was asked to see her after a CAT scan demonstrated appendicitis. She reports the onset of right lower quadrant abdominal pain since yesterday morning. The pain is described as sharp and moderate to severe. She has also had nausea and vomiting but no fever. After discussion with her son, she does seem to have a history of occasional right upper quadrant abdominal discomfort after meals but this is a separate discomfort. She is otherwise without complaints. Bowel movements have been normal  Past Medical History  Diagnosis Date  . Hypertension     Past Surgical History  Procedure Laterality Date  . Multiple tooth extractions      Family History  Problem Relation Age of Onset  . Heart disease Mother   . Diabetes Mother   . Hypertension Mother   . Hyperlipidemia Mother   . Early death Father    Social History:  reports that she has never smoked. She does not have any smokeless tobacco history on file. She reports that she does not drink alcohol or use illicit drugs.  Allergies:  Allergies  Allergen Reactions  . Benadryl [Diphenhydramine Hcl] Other (See Comments)    Couldn't sleep  . Other     Cough medications     (Not in a hospital admission)  Results for orders placed or performed during the hospital encounter of 04/21/15 (from the past 48 hour(s))  Urinalysis, Routine w reflex microscopic (not at [facility])     Status: Abnormal   Collection Time: 04/20/15  9:35 PM  Result Value Ref Range   Color, Urine YELLOW YELLOW   APPearance CLEAR CLEAR   Specific Gravity, Urine 1.024 1.005 - 1.030   pH 6.0 5.0 - 8.0   Glucose, UA NEGATIVE NEGATIVE mg/dL   Hgb urine dipstick MODERATE (A) NEGATIVE   Bilirubin Urine NEGATIVE NEGATIVE   Ketones, ur 40 (A) NEGATIVE mg/dL   Protein, ur NEGATIVE NEGATIVE mg/dL   Urobilinogen, UA 0.2  0.0 - 1.0 mg/dL   Nitrite NEGATIVE NEGATIVE   Leukocytes, UA SMALL (A) NEGATIVE  Urine microscopic-add on     Status: None   Collection Time: 04/20/15  9:35 PM  Result Value Ref Range   Squamous Epithelial / LPF RARE RARE   WBC, UA 3-6 <3 WBC/hpf   RBC / HPF 0-2 <3 RBC/hpf   Bacteria, UA RARE RARE  Lipase, blood     Status: Abnormal   Collection Time: 04/20/15  9:45 PM  Result Value Ref Range   Lipase 20 (L) 22 - 51 U/L  Comprehensive metabolic panel     Status: Abnormal   Collection Time: 04/20/15  9:45 PM  Result Value Ref Range   Sodium 136 135 - 145 mmol/L   Potassium 3.7 3.5 - 5.1 mmol/L   Chloride 101 101 - 111 mmol/L   CO2 24 22 - 32 mmol/L   Glucose, Bld 125 (H) 65 - 99 mg/dL   BUN 12 6 - 20 mg/dL   Creatinine, Ser 0.60 0.44 - 1.00 mg/dL   Calcium 9.4 8.9 - 10.3 mg/dL   Total Protein 7.7 6.5 - 8.1 g/dL   Albumin 4.4 3.5 - 5.0 g/dL   AST 22 15 - 41 U/L   ALT 17 14 - 54 U/L   Alkaline Phosphatase 72 38 - 126 U/L   Total Bilirubin 1.3 (  H) 0.3 - 1.2 mg/dL   GFR calc non Af Amer >60 >60 mL/min   GFR calc Af Amer >60 >60 mL/min    Comment: (NOTE) The eGFR has been calculated using the CKD EPI equation. This calculation has not been validated in all clinical situations. eGFR's persistently <60 mL/min signify possible Chronic Kidney Disease.    Anion gap 11 5 - 15  CBC     Status: None   Collection Time: 04/20/15  9:45 PM  Result Value Ref Range   WBC 6.1 4.0 - 10.5 K/uL   RBC 4.36 3.87 - 5.11 MIL/uL   Hemoglobin 13.2 12.0 - 15.0 g/dL   HCT 39.0 36.0 - 46.0 %   MCV 89.4 78.0 - 100.0 fL   MCH 30.3 26.0 - 34.0 pg   MCHC 33.8 30.0 - 36.0 g/dL   RDW 12.6 11.5 - 15.5 %   Platelets 276 150 - 400 K/uL   Ct Abdomen Pelvis W Contrast  04/21/2015   CLINICAL DATA:  Acute onset of right lower quadrant abdominal pain and vomiting. Initial encounter.  EXAM: CT ABDOMEN AND PELVIS WITH CONTRAST  TECHNIQUE: Multidetector CT imaging of the abdomen and pelvis was performed using  the standard protocol following bolus administration of intravenous contrast.  CONTRAST:  81m OMNIPAQUE IOHEXOL 300 MG/ML  SOLN  COMPARISON:  CT of the abdomen and pelvis performed 01/29/2005  FINDINGS: Minimal bibasilar atelectasis is noted.  Scattered hepatic cysts are seen, measuring up to 4.3 cm in size. The liver and spleen are otherwise unremarkable. Stones are seen at the base of the gallbladder; the gallbladder is otherwise unremarkable. There is dilatation of the common bile duct to 1.0 cm in size. This may be slightly more prominent than on the prior study, with suggestion of minimal intrahepatic biliary ductal prominence. Distal obstruction cannot be entirely excluded.  The gallbladder is within normal limits. The pancreas and adrenal glands are unremarkable.  The kidneys are unremarkable in appearance. There is no evidence of hydronephrosis. No renal or ureteral stones are seen. No perinephric stranding is appreciated.  No free fluid is identified. The small bowel is unremarkable in appearance. The stomach is within normal limits. No acute vascular abnormalities are seen.  The appendix is dilated to 1.9 cm in maximal diameter, with minimal associated soft tissue inflammation and multiple appendicoliths. The appendix is largely filled with fluid. There is no evidence of perforation or abscess formation at this time.  The colon is unremarkable in appearance.  The bladder is mildly distended and grossly remarkable. A small focus of enhancement within the uterus may reflect a tiny fibroids. The ovaries are relatively symmetric. No suspicious adnexal masses are seen. No inguinal lymphadenopathy is seen.  No acute osseous abnormalities are identified.  IMPRESSION: 1. Suspect acute appendicitis, with dilatation of the appendix to 1.9 cm in maximal diameter, minimal associated soft tissue inflammation and multiple appendicoliths. The appendix is largely filled with fluid. No evidence for perforation or abscess  formation at this time. 2. Dilatation of the common bile duct to 1.0 cm in size, slightly more prominent than on the prior study, with suggestion of minimal intrahepatic biliary ductal prominence. Cholelithiasis noted. Gallbladder otherwise unremarkable. Distal obstruction cannot be entirely excluded. Would consider MRCP for further evaluation, if the patient can hold her breath for the study. 3. Scattered hepatic cysts noted. 4. Question of tiny uterine fibroid. These results were called by telephone at the time of interpretation on 04/21/2015 at 5:07 am to Dr. KCyril Mourning  WARD, who verbally acknowledged these results.   Electronically Signed   By: Garald Balding M.D.   On: 04/21/2015 05:08    Review of Systems  All other systems reviewed and are negative.   Blood pressure 131/80, pulse 64, temperature 98.5 F (36.9 C), temperature source Oral, resp. rate 15, last menstrual period 03/01/2012, SpO2 95 %. Physical Exam  Constitutional: She is oriented to person, place, and time. She appears well-developed and well-nourished. No distress.  HENT:  Head: Normocephalic and atraumatic.  Right Ear: External ear normal.  Left Ear: External ear normal.  Nose: Nose normal.  Mouth/Throat: Oropharynx is clear and moist. No oropharyngeal exudate.  Eyes: Conjunctivae are normal. Pupils are equal, round, and reactive to light. No scleral icterus.  Neck: Normal range of motion. Neck supple. No tracheal deviation present.  Cardiovascular: Normal rate, regular rhythm, normal heart sounds and intact distal pulses.   No murmur heard. Respiratory: Effort normal and breath sounds normal. No respiratory distress.  GI: Soft. There is tenderness. There is guarding.  There is tenderness with guarding in the right lower quadrant. There is no right upper quadrant tenderness  Musculoskeletal: Normal range of motion. She exhibits no edema or tenderness.  Neurological: She is alert and oriented to person, place, and time.   Skin: Skin is warm and dry. No rash noted. She is not diaphoretic. No erythema.  Psychiatric: Her behavior is normal. Judgment normal.     Assessment/Plan Acute appendicitis  I have reviewed her CT scan. She does have gallstones also with dilation of her common bile duct and a slightly elevated bilirubin. She is currently not symptomatic from this but apparently has had symptoms consistent with biliary colic in the past. Her current problem is acute appendicitis. She is being admitted for IV anabiotic's and appendectomy today. I briefly discussed the surgical procedure with the patient through her son.  We may or may not be able to address the gallbladder this admission.  Zeshan Sena A 04/21/2015, 6:14 AM

## 2015-04-21 NOTE — ED Notes (Signed)
Pt ambulated to the bathroom and states that she vomited while in the bathroom.

## 2015-04-21 NOTE — Op Note (Signed)
Appendectomy, Lap, Procedure Note  Indications: The patient presented with a history of right-sided abdominal pain. A CT scan revealed findings consistent with acute appendicitis.  Pre-operative Diagnosis: Acute appendicitis without mention of peritonitis  Post-operative Diagnosis: Same  Surgeon: Nattie Lazenby K.   Assistants: none  Anesthesia: General endotracheal anesthesia  ASA Class: 2E Procedure Details  The patient was seen again in the Holding Room. The risks, benefits, complications, treatment options, and expected outcomes were discussed with the patient and/or family. The possibilities of reaction to medication, perforation of viscus, bleeding, recurrent infection, finding a normal appendix, the need for additional procedures, failure to diagnose a condition, and creating a complication requiring transfusion or operation were discussed. There was concurrence with the proposed plan and informed consent was obtained. The site of surgery was properly noted. The patient was taken to Operating Room, identified as Michele Webb and the procedure verified as Appendectomy. A Time Out was held and the above information confirmed.  The patient was placed in the supine position and general anesthesia was induced.  The abdomen was prepped and draped in a sterile fashion. A one centimeter infraumbilical incision was made.  Dissection was carried down to the fascia bluntly.  The fascia was incised vertically.  We entered the peritoneal cavity bluntly.  A pursestring suture was passed around the incision with a 0 Vicryl.  The Hasson cannula was introduced into the abdomen and the tails of the suture were used to hold the Hasson in place.   The pneumoperitoneum was then established maintaining a maximum pressure of 15 mmHg.  Additional 5 mm cannulas then placed in the left lower quadrant of the abdomen and the right upper quadrant under direct visualization. A careful evaluation of the entire abdomen was  carried out. The patient was placed in Trendelenburg and left lateral decubitus position.  The scope was moved to the right upper quadrant port site. The cecum was mobilized medially.  The appendix was quite swollen but no sign of perforation.  The appendix was carefully dissected. The appendix was skeletonized with the harmonic scalpel.   The appendix was divided at its base using an endo-GIA stapler. Minimal appendiceal stump was left in place. There was no evidence of bleeding, leakage, or complication after division of the appendix. Irrigation was also performed and irrigate suctioned from the abdomen as well.  The umbilical port site was closed with the purse string suture. There was no residual palpable fascial defect.  The trocar site skin wounds were closed with 4-0 Monocryl.  Instrument, sponge, and needle counts were correct at the conclusion of the case.   Findings: The appendix was found to be inflamed. There were not signs of necrosis.  There was not perforation. There was not abscess formation.  Estimated Blood Loss:  Minimal         Drains: none         Specimens: appendix         Complications:  None; patient tolerated the procedure well.         Disposition: PACU - hemodynamically stable.         Condition: stable  Wilmon Arms. Corliss Skains, MD, [facility]  General/ Trauma Surgery  04/21/2015 12:19 PM

## 2015-04-21 NOTE — ED Provider Notes (Signed)
This chart was scribed for  Layla Maw Ward, DO by Bethel Born, ED Scribe. This patient was seen in room A02C/A02C and the patient's care was started at 1:37 AM.  TIME SEEN: 1:37 AM   CHIEF COMPLAINT: abdominal pain  HPI: Michele Webb is a 54 y.o. female with no significant PMHx who presents to the Emergency Department complaining of new lower abdominal pain with onset this morning. The constant pain is rated 10/10 in severity at worst and described as aching. Associated vomiting. Pt denies fever, chills, diarrhea, chest pain, SOB, dysuria, hematuria, hematochezia, vaginal bleeding, and abnormal vaginal discharge. No previous abdominal surgeries. No PCP. No sick contacts or recent travel.   ROS: See HPI Constitutional: no fever  Eyes: no drainage  ENT: no runny nose   Cardiovascular:  no chest pain  Resp: no SOB  GI: vomiting GU: no dysuria Integumentary: no rash  Allergy: no hives  Musculoskeletal: no leg swelling  Neurological: no slurred speech ROS otherwise negative  PAST MEDICAL HISTORY/PAST SURGICAL HISTORY:  History reviewed. No pertinent past medical history.  MEDICATIONS:  Prior to Admission medications   Medication Sig Start Date End Date Taking? Authorizing Provider  hydrochlorothiazide (MICROZIDE) 12.5 MG capsule Take 1 capsule (12.5 mg total) by mouth daily. 08/09/12 08/09/13  Lorretta Harp, MD    ALLERGIES:  Allergies  Allergen Reactions  . Benadryl [Diphenhydramine Hcl]   . Other     Cough medications    SOCIAL HISTORY:  History  Substance Use Topics  . Smoking status: Never Smoker   . Smokeless tobacco: Not on file  . Alcohol Use: No    FAMILY HISTORY: Family History  Problem Relation Age of Onset  . Heart disease Mother   . Diabetes Mother   . Hypertension Mother   . Hyperlipidemia Mother   . Early death Father     EXAM: BP 134/85 mmHg  Pulse 59  Temp(Src) 98.5 F (36.9 C) (Oral)  Resp 10  SpO2 99%  LMP 03/01/2012 CONSTITUTIONAL: Alert  and oriented and responds appropriately to questions. Well-appearing; well-nourished HEAD: Normocephalic EYES: Conjunctivae clear, PERRL ENT: normal nose; no rhinorrhea; moist mucous membranes; pharynx without lesions noted NECK: Supple, no meningismus, no LAD  CARD: RRR; S1 and S2 appreciated; no murmurs, no clicks, no rubs, no gallops RESP: Normal chest excursion without splinting or tachypnea; breath sounds clear and equal bilaterally; no wheezes, no rhonchi, no rales, no hypoxia or respiratory distress, speaking full sentences ABD/GI: Normal bowel sounds; non-distended; soft, TTP throughout abdomen worse in the lower region, rebound, guarding, tender at McBurney's point, no peritoneal signs BACK:  The back appears normal and is non-tender to palpation, there is no CVA tenderness EXT: Normal ROM in all joints; non-tender to palpation; no edema; normal capillary refill; no cyanosis, no calf tenderness or swelling    SKIN: Normal color for age and race; warm NEURO: Moves all extremities equally, sensation to light touch intact diffusely, cranial nerves II through XII intact PSYCH: The patient's mood and manner are appropriate. Grooming and personal hygiene are appropriate.  MEDICAL DECISION MAKING: Patient here with lower abdominal pain, right lower quadrant pain. Tender at McBurney's point. Nonsurgical abdomen. Hemodynamically stable, afebrile. Labs unremarkable. Urine shows small leukocytes, moderate hemoglobin but rare bacteria. Culture is pending. We'll obtain CT of her abdomen pelvis for further evaluation. Will give pain medication, nausea medicine, IV fluids.  ED PROGRESS: Discussed with radiologist. Patient CT scan shows acute appendicitis. Will discuss with general surgery and give IV antibiotics.  I personally performed the services described in this documentation, which was scribed in my presence. The recorded information has been reviewed and is accurate.      Layla Maw  Ward, DO 04/21/15 [phone]

## 2015-04-21 NOTE — Transfer of Care (Signed)
Immediate Anesthesia Transfer of Care Note  Patient: Michele Webb  Procedure(s) Performed: Procedure(s): APPENDECTOMY LAPAROSCOPIC (N/A)  Patient Location: PACU  Anesthesia Type:General  Level of Consciousness: oriented, sedated, patient cooperative and responds to stimulation  Airway & Oxygen Therapy: Patient Spontanous Breathing and Patient connected to nasal cannula oxygen  Post-op Assessment: Report given to RN, Post -op Vital signs reviewed and stable, Patient moving all extremities and Patient moving all extremities X 4  Post vital signs: Reviewed and stable  Last Vitals:  Filed Vitals:   04/21/15 0827  BP: 117/74  Pulse: 59  Temp: 36.8 C  Resp: 16    Complications: No apparent anesthesia complications

## 2015-04-21 NOTE — Anesthesia Procedure Notes (Signed)
Procedure Name: Intubation Date/Time: 04/21/2015 11:29 AM Performed by: Wray Kearns A Pre-anesthesia Checklist: Patient identified, Timeout performed, Suction available, Emergency Drugs available and Patient being monitored Patient Re-evaluated:Patient Re-evaluated prior to inductionOxygen Delivery Method: Circle system utilized Preoxygenation: Pre-oxygenation with 100% oxygen Intubation Type: IV induction, Rapid sequence and Cricoid Pressure applied Laryngoscope Size: Mac and 3 Grade View: Grade II Tube type: Oral Tube size: 7.5 mm Number of attempts: 1 Airway Equipment and Method: Stylet Placement Confirmation: ETT inserted through vocal cords under direct vision,  breath sounds checked- equal and bilateral and positive ETCO2 Secured at: 21 cm Tube secured with: Tape Dental Injury: Teeth and Oropharynx as per pre-operative assessment

## 2015-04-22 ENCOUNTER — Encounter (HOSPITAL_COMMUNITY): Payer: Self-pay | Admitting: Surgery

## 2015-04-22 MED ORDER — OXYCODONE-ACETAMINOPHEN 5-325 MG PO TABS: 1.0000 | ORAL_TABLET | Freq: Three times a day (TID) | ORAL | Status: DC | PRN

## 2015-04-22 NOTE — Discharge Summary (Signed)
Physician Discharge Summary  Patient ID: Michele Webb MRN: [id number] DOB/AGE: 56-Feb-1962 56 y.o.  Admit date: 04/21/2015 Discharge date: 04/22/2015  Admitting Diagnosis: Acute appendicitis   Discharge Diagnosis Patient Active Problem List   Diagnosis Date Noted  . Appendicitis 04/21/2015  . HTN (hypertension) 08/09/2012  . Dizziness 08/09/2012  . Health care maintenance 08/09/2012  . Pap smear abnormality of cervix 01/06/2012  . De Quervain's tenosynovitis, left 11/04/2011    Consultants none  Imaging: Ct Abdomen Pelvis W Contrast  04/21/2015   CLINICAL DATA:  Acute onset of right lower quadrant abdominal pain and vomiting. Initial encounter.  EXAM: CT ABDOMEN AND PELVIS WITH CONTRAST  TECHNIQUE: Multidetector CT imaging of the abdomen and pelvis was performed using the standard protocol following bolus administration of intravenous contrast.  CONTRAST:  90mL OMNIPAQUE IOHEXOL 300 MG/ML  SOLN  COMPARISON:  CT of the abdomen and pelvis performed 01/29/2005  FINDINGS: Minimal bibasilar atelectasis is noted.  Scattered hepatic cysts are seen, measuring up to 4.3 cm in size. The liver and spleen are otherwise unremarkable. Stones are seen at the base of the gallbladder; the gallbladder is otherwise unremarkable. There is dilatation of the common bile duct to 1.0 cm in size. This may be slightly more prominent than on the prior study, with suggestion of minimal intrahepatic biliary ductal prominence. Distal obstruction cannot be entirely excluded.  The gallbladder is within normal limits. The pancreas and adrenal glands are unremarkable.  The kidneys are unremarkable in appearance. There is no evidence of hydronephrosis. No renal or ureteral stones are seen. No perinephric stranding is appreciated.  No free fluid is identified. The small bowel is unremarkable in appearance. The stomach is within normal limits. No acute vascular abnormalities are seen.  The appendix is dilated to 1.9 cm in maximal  diameter, with minimal associated soft tissue inflammation and multiple appendicoliths. The appendix is largely filled with fluid. There is no evidence of perforation or abscess formation at this time.  The colon is unremarkable in appearance.  The bladder is mildly distended and grossly remarkable. A small focus of enhancement within the uterus may reflect a tiny fibroids. The ovaries are relatively symmetric. No suspicious adnexal masses are seen. No inguinal lymphadenopathy is seen.  No acute osseous abnormalities are identified.  IMPRESSION: 1. Suspect acute appendicitis, with dilatation of the appendix to 1.9 cm in maximal diameter, minimal associated soft tissue inflammation and multiple appendicoliths. The appendix is largely filled with fluid. No evidence for perforation or abscess formation at this time. 2. Dilatation of the common bile duct to 1.0 cm in size, slightly more prominent than on the prior study, with suggestion of minimal intrahepatic biliary ductal prominence. Cholelithiasis noted. Gallbladder otherwise unremarkable. Distal obstruction cannot be entirely excluded. Would consider MRCP for further evaluation, if the patient can hold her breath for the study. 3. Scattered hepatic cysts noted. 4. Question of tiny uterine fibroid. These results were called by telephone at the time of interpretation on 04/21/2015 at 5:07 am to Dr. Rochele Raring, who verbally acknowledged these results.   Electronically Signed   By: Roanna Raider M.D.   On: 04/21/2015 05:08    Procedures Laparoscopic appendectomy---Dr. Orpah Clinton Course:  Michele Webb is a 54 year old female who presented to [facility] with RLQ abdominal pain.  Workup showed acute appendicitis.  Patient was admitted and underwent procedure listed above.  Tolerated procedure well and was transferred to the floor.  Diet was advanced as tolerated.  On POD#1, the  patient was voiding well, tolerating diet, ambulating well, pain well controlled, vital  signs stable, incisions c/d/i and felt stable for discharge home. Medication risks, benefits and therapeutic alternatives were reviewed with the patient.  She verbalizes understanding.  Patient will follow up in our office in 3 weeks and knows to call with questions or concerns.  Physical Exam: General:  Alert, NAD, pleasant, comfortable Abd:  Soft, ND, mild tenderness, incisions C/D/I    Medication List    TAKE these medications        oxyCODONE-acetaminophen 5-325 MG per tablet  Commonly known as:  PERCOCET/ROXICET  Take 1-2 tablets by mouth every 8 (eight) hours as needed for moderate pain.             Follow-up Information    Follow up with [facility] On 05/14/2015.   Specialty:  General Surgery   Why:  arrive by 2:30PM for a 3PM post operative check up.    Contact information:   [address] [place] [address] [phone]       Signed: Ashok Norris, [facility] [phone]  04/22/2015, 10:04 AM

## 2015-04-22 NOTE — Discharge Instructions (Signed)

## 2015-04-22 NOTE — Progress Notes (Signed)
Discussed discharge summary with patient. Reviewed all medications with patient. Patient received Rx. Patient did not have any questions. Patient ready for discharge. 

## 2015-04-23 LAB — URINE CULTURE

## 2015-09-06 IMAGING — CT CT ABD-PELV W/ CM
2 of 5 series · 14 of 46 positions shown, 16 images · IV contrast (Omni 300)
Comparison: CT of the abdomen and pelvis performed 01/29/2005

CLINICAL DATA: Acute onset of right lower quadrant abdominal pain
and vomiting. Initial encounter.

EXAM:
CT ABDOMEN AND PELVIS WITH CONTRAST
TECHNIQUE: Multidetector CT imaging of the abdomen and pelvis was performed
using the standard protocol following bolus administration of
intravenous contrast.
CONTRAST:  90mL OMNIPAQUE IOHEXOL 300 MG/ML  SOLN

[Series 2: abd/ pelvis 5.0 i30f 1 · axial · 0.77mm/px · z∈[+619,+1029]mm · 11 of 94 slices shown, 13 images]
[im 6/94  soft-tissue]
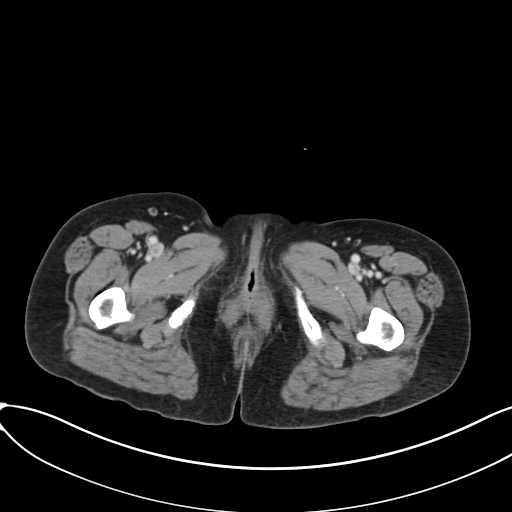
[im 6/94  bone]
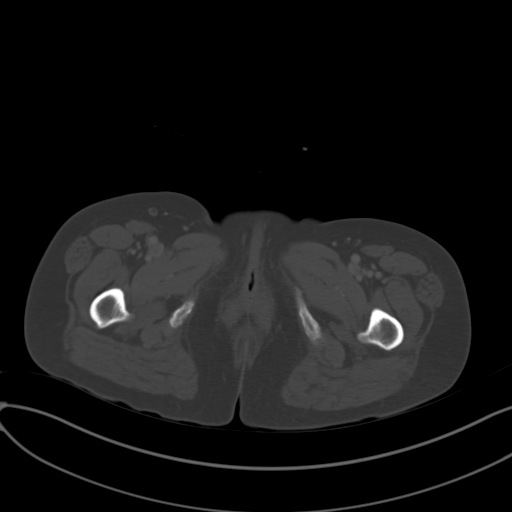
[im 16/94  soft-tissue]
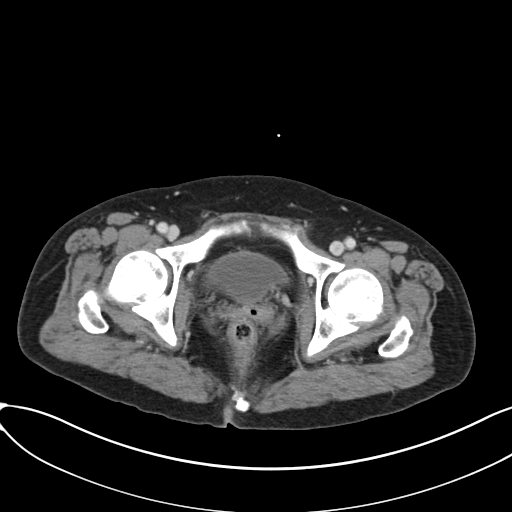
[im 21/94  soft-tissue]
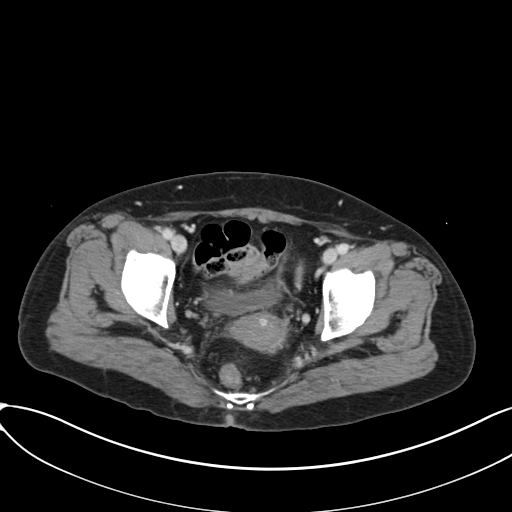
[im 32/94  soft-tissue]
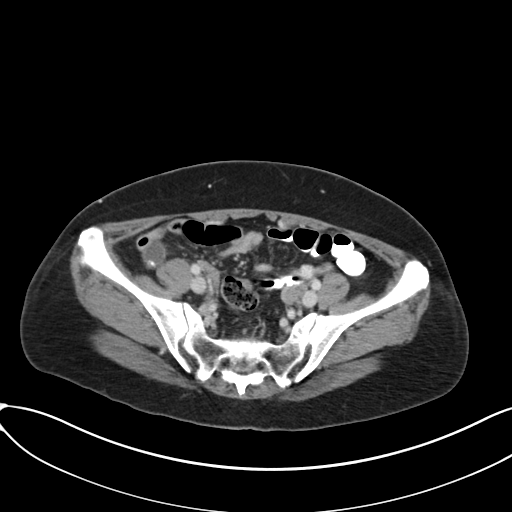
[im 37/94  soft-tissue]
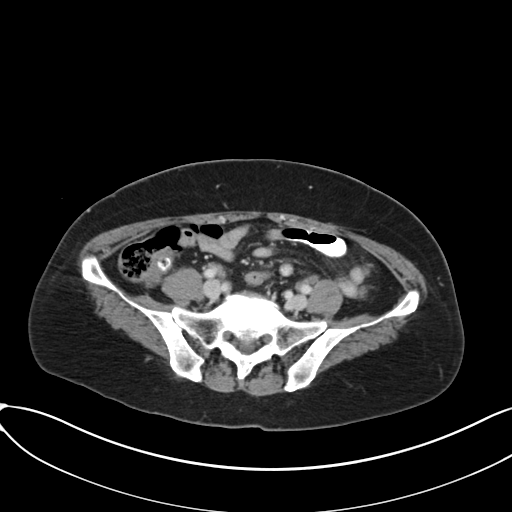
[im 47/94  soft-tissue]
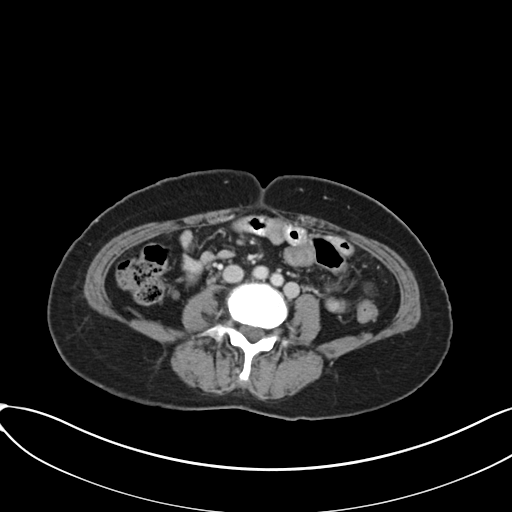
[im 57/94  soft-tissue]
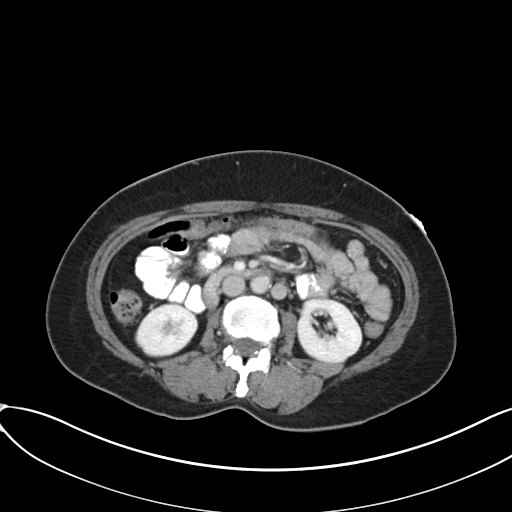
[im 63/94  soft-tissue]
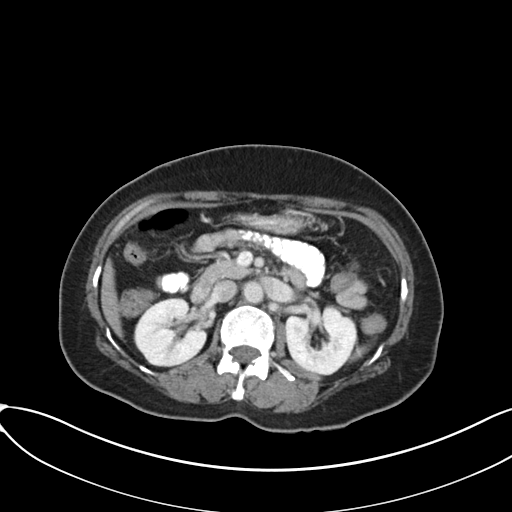
[im 73/94  soft-tissue]
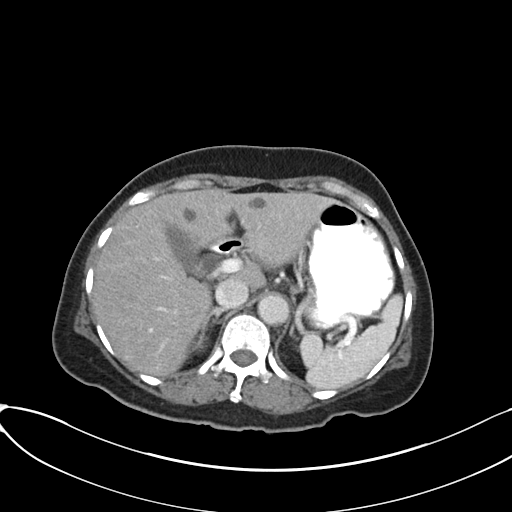
[im 73/94  bone]
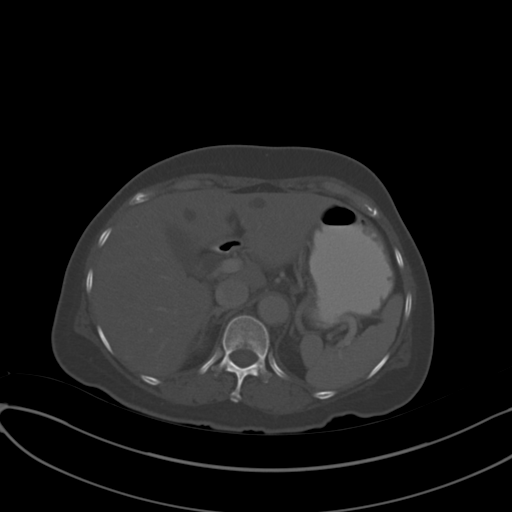
[im 78/94  soft-tissue]
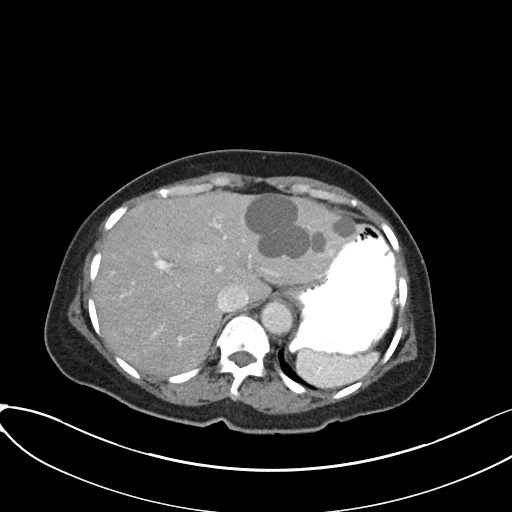
[im 88/94  soft-tissue]
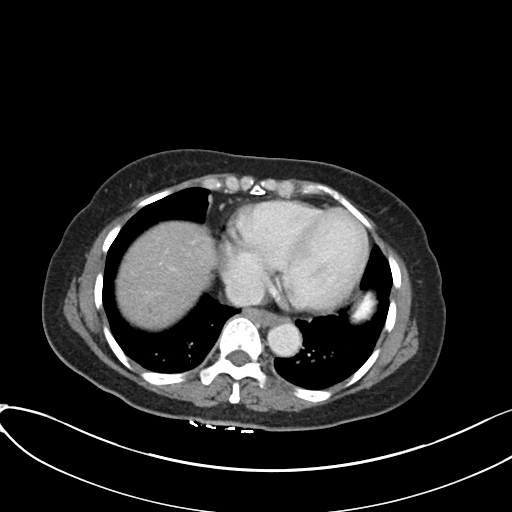

[Series 5: coronals · coronal · 0.56mm/px · 3 of 116 slices shown]
[im 39/116  soft-tissue]
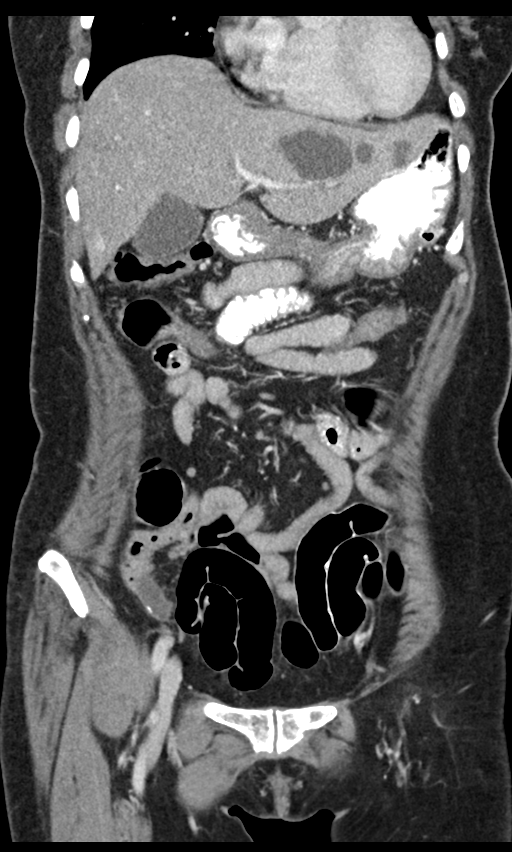
[im 52/116  soft-tissue]
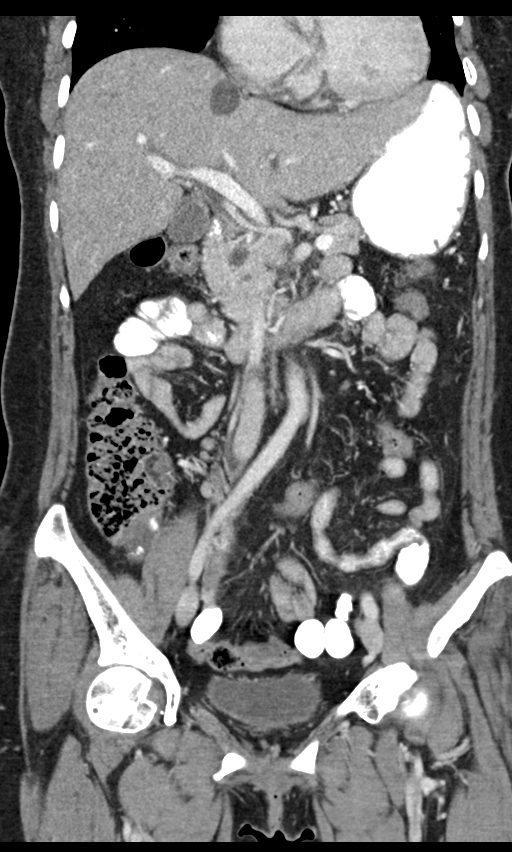
[im 64/116  soft-tissue]
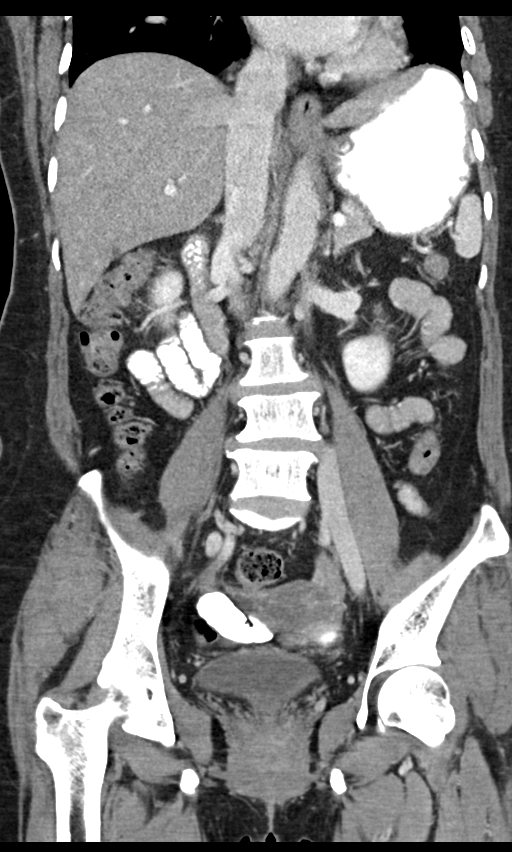

[14 of 46 positions shown; findings below may reference images not displayed]

FINDINGS: Minimal bibasilar atelectasis is noted.

Scattered hepatic cysts are seen, measuring up to 4.3 cm in size.
The liver and spleen are otherwise unremarkable. Stones are seen at
the base of the gallbladder; the gallbladder is otherwise
unremarkable. There is dilatation of the common bile duct to 1.0 cm
in size. This may be slightly more prominent than on the prior
study, with suggestion of minimal intrahepatic biliary ductal
prominence. Distal obstruction cannot be entirely excluded.

The gallbladder is within normal limits. The pancreas and adrenal
glands are unremarkable.

The kidneys are unremarkable in appearance. There is no evidence of
hydronephrosis. No renal or ureteral stones are seen. No perinephric
stranding is appreciated.

No free fluid is identified. The small bowel is unremarkable in
appearance. The stomach is within normal limits. No acute vascular
abnormalities are seen.

The appendix is dilated to 1.9 cm in maximal diameter, with minimal
associated soft tissue inflammation and multiple appendicoliths. The
appendix is largely filled with fluid. There is no evidence of
perforation or abscess formation at this time.

The colon is unremarkable in appearance.

The bladder is mildly distended and grossly remarkable. A small
focus of enhancement within the uterus may reflect a tiny fibroids.
The ovaries are relatively symmetric. No suspicious adnexal masses
are seen. No inguinal lymphadenopathy is seen.

No acute osseous abnormalities are identified.
IMPRESSION: 1. Suspect acute appendicitis, with dilatation of the appendix to
1.9 cm in maximal diameter, minimal associated soft tissue
inflammation and multiple appendicoliths. The appendix is largely
filled with fluid. No evidence for perforation or abscess formation
at this time.
2. Dilatation of the common bile duct to 1.0 cm in size, slightly
more prominent than on the prior study, with suggestion of minimal
intrahepatic biliary ductal prominence. Cholelithiasis noted.
Gallbladder otherwise unremarkable. Distal obstruction cannot be
entirely excluded. Would consider MRCP for further evaluation, if
the patient can hold her breath for the study.
3. Scattered hepatic cysts noted.
4. Question of tiny uterine fibroid.
These results were called by telephone at the time of interpretation
on 04/21/2015 at [DATE] to Dr. BRUNA ORTUNO, who verbally
acknowledged these results.

## 2017-04-05 ENCOUNTER — Ambulatory Visit: Payer: Self-pay

## 2017-04-08 ENCOUNTER — Ambulatory Visit: Payer: Self-pay

## 2017-04-13 ENCOUNTER — Ambulatory Visit (INDEPENDENT_AMBULATORY_CARE_PROVIDER_SITE_OTHER): Payer: Self-pay | Admitting: Internal Medicine

## 2017-04-13 VITALS — BP 149/100 | HR 59 | Temp 98.2°F | Wt 131.4 lb

## 2017-04-13 DIAGNOSIS — Z0000 Encounter for general adult medical examination without abnormal findings: Secondary | ICD-10-CM

## 2017-04-13 DIAGNOSIS — M545 Low back pain, unspecified: Secondary | ICD-10-CM | POA: Insufficient documentation

## 2017-04-13 DIAGNOSIS — I10 Essential (primary) hypertension: Secondary | ICD-10-CM

## 2017-04-13 DIAGNOSIS — Z1231 Encounter for screening mammogram for malignant neoplasm of breast: Secondary | ICD-10-CM

## 2017-04-13 DIAGNOSIS — G8929 Other chronic pain: Secondary | ICD-10-CM

## 2017-04-13 DIAGNOSIS — Z0189 Encounter for other specified special examinations: Secondary | ICD-10-CM

## 2017-04-13 NOTE — Assessment & Plan Note (Addendum)
In 2013, the pt was noted to have elevated BP in clinic and advised to start HCTZ, however, the pt reports never having started that medication. Today her BP is elevated at 150/87, though there are no more recent values. Will hold off on starting an anti-HTN at this time. At her f/u visit an additional BP value, as well as lab information from BMP can guide tx choice.   -BMP, reassess at f/u

## 2017-04-13 NOTE — Assessment & Plan Note (Signed)
The pt has not been in the clinic since 2013 and is in need of various recommended health maintenance screenings. Today, put in an order for a screening mammogram and the pt was provided information on scholarship program for mammograms. A Pap smear can be performed at her next visit and colonoscopy in the future.   -Mammogram -Pap smear at f/u -Lipid profile, A1c
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
## 2017-04-13 NOTE — Assessment & Plan Note (Signed)
The pt reports a history of chronic back pain which appears to have little impact on her function, and is most bothersome at night, though it is not well relieved with OTC NSAIDs. There are no red flags that would indicate imaging is necessary at this time. Encouraged the pt to try conservative management with OTC topicals as a different type of therapy and will reassess at f/u.

## 2017-04-13 NOTE — Patient Instructions (Addendum)
Nice to meet you Michele Webb.   For your back pain, since ibuprofen has not been working, we can try some topical creams to see if they work better. Capsaicin cream is an option. Apply it to the area and it will have a hot feeling at first and then relieve the pain. After applying be sure to wash your hands right after, and do not let others touch the area. You can also try Lidocaine cream-this is a numbing medicine that can be in a cream. Both of these can be found at a pharmacy like [facility] or at stores like [address]. If these don't work we can look at other options at your next visit.   We'll get some routine blood work today and I'll see you back in a month to talk about the results, check your blood pressure again, and do a Pap smear. Also, use the phone number to set up a low or no cost mammogram.

## 2017-04-14 LAB — BMP8+ANION GAP
ANION GAP: 18 mmol/L (ref 10.0–18.0)
BUN/Creatinine Ratio: 23 (ref 9–23)
BUN: 14 mg/dL (ref 6–24)
CALCIUM: 9.6 mg/dL (ref 8.7–10.2)
CHLORIDE: 101 mmol/L (ref 96–106)
CO2: 24 mmol/L (ref 20–29)
Creatinine, Ser: 0.62 mg/dL (ref 0.57–1.00)
GFR calc Af Amer: 115 mL/min/{1.73_m2} (ref >59)
GFR, EST NON AFRICAN AMERICAN: 100 mL/min/{1.73_m2} (ref >59)
GLUCOSE: 88 mg/dL (ref 65–99)
Potassium: 4.6 mmol/L (ref 3.5–5.2)
SODIUM: 143 mmol/L (ref 134–144)

## 2017-04-14 LAB — LIPID PANEL
CHOL/HDL RATIO: 4.2 ratio (ref 0.0–4.4)
Cholesterol, Total: 214 mg/dL — ABNORMAL HIGH (ref 100–199)
HDL: 51 mg/dL (ref >39)
LDL CALC: 127 mg/dL — AB (ref 0–99)
TRIGLYCERIDES: 181 mg/dL — AB (ref 0–149)
VLDL Cholesterol Cal: 36 mg/dL (ref 5–40)

## 2017-04-14 LAB — HEMOGLOBIN A1C
Est. average glucose Bld gHb Est-mCnc: 126 mg/dL
HEMOGLOBIN A1C: 6 % — AB (ref 4.8–5.6)

## 2017-04-17 NOTE — Progress Notes (Signed)
Internal Medicine Clinic Attending  I saw and evaluated the patient.  I personally confirmed the key portions of the history and exam documented by Dr. Harden and I reviewed pertinent patient test results.  The assessment, diagnosis, and plan were formulated together and I agree with the documentation in the resident's note.  

## 2017-06-11 ENCOUNTER — Ambulatory Visit: Payer: Self-pay

## 2017-06-15 ENCOUNTER — Ambulatory Visit: Payer: Self-pay

## 2017-06-16 ENCOUNTER — Ambulatory Visit (INDEPENDENT_AMBULATORY_CARE_PROVIDER_SITE_OTHER): Payer: Self-pay | Admitting: Internal Medicine

## 2017-06-16 VITALS — BP 145/87 | HR 66 | Temp 98.4°F | Wt 131.7 lb

## 2017-06-16 DIAGNOSIS — Z01419 Encounter for gynecological examination (general) (routine) without abnormal findings: Secondary | ICD-10-CM

## 2017-06-16 DIAGNOSIS — Z0000 Encounter for general adult medical examination without abnormal findings: Secondary | ICD-10-CM

## 2017-06-16 DIAGNOSIS — I10 Essential (primary) hypertension: Secondary | ICD-10-CM

## 2017-06-16 MED ORDER — LISINOPRIL 5 MG PO TABS: 5.0000 mg | ORAL_TABLET | Freq: Every day | ORAL | 1 refills | Status: DC

## 2017-06-16 NOTE — Patient Instructions (Signed)
Ms. Jeangilles  It was a pleasure meeting you today. Please start taking lisinopril 5 mg daily to help with your blood pressure control. Please follow-up with your primary care doctor on November 9.

## 2017-06-16 NOTE — Progress Notes (Signed)
   CC: Follow-up on essential hypertension  HPI:  [name] is a 56 y.o. female with history noted below that presents to the acute care clinic for follow-up on essential hypertension.  Please see problem based charting for the status of patient's chronic medical conditions.  Past Medical History:  Diagnosis Date  . Hypertension     Review of Systems:  Review of Systems  Eyes: Negative for blurred vision.  Respiratory: Negative for shortness of breath.   Cardiovascular: Negative for chest pain.  Genitourinary: Negative for dysuria, frequency and urgency.     Physical Exam:  Vitals:   06/16/17 1551  BP: (!) 145/87  Pulse: 66  Temp: 98.4 F (36.9 C)  TempSrc: Oral  Weight: 131 lb 11.2 oz (59.7 kg)   Physical Exam  Constitutional: She is well-developed, well-nourished, and in no distress.  Cardiovascular: Normal rate, regular rhythm and normal heart sounds.  Exam reveals no gallop and no friction rub.   No murmur heard. Pulmonary/Chest: Effort normal and breath sounds normal. No respiratory distress. She has no wheezes. She has no rales.  Musculoskeletal: She exhibits no edema.  Skin: Skin is warm and dry.    Assessment & Plan:   See encounters tab for problem based medical decision making.    Patient discussed with Dr. Heide Spark

## 2017-06-18 NOTE — Assessment & Plan Note (Addendum)
Assessment: Essential hypertension Patient was seen in the clinic on 04/13/2017 to re-establish care and noted to have an elevated blood pressure of 150/87.  Since patient had not had blood work since 2013 a bmet was obtained and antihypertensive were not started.  Creatinine on 03/2017 was 0.62.  Hemoglobin A1C was checked as well and was 6.0. Today's blood pressure is 145/87 elevated and not at goal of <140/80. Will start lisinopril 5 mg.  Plan -lisinopril  daily -bmet in November at follow up

## 2017-06-18 NOTE — Assessment & Plan Note (Signed)
Assessment:  Pap smear Last Pap smear was in 2013 and cytology was negative for intraepithelial or malignancy. Patient is due for Pap smear and this was done today in office.   Plan - In office Pap smear

## 2017-06-21 NOTE — Progress Notes (Signed)
Internal Medicine Clinic Attending  Case discussed with Dr. Hoffman at the time of the visit.  We reviewed the resident's history and exam and pertinent patient test results.  I agree with the assessment, diagnosis, and plan of care documented in the resident's note.  

## 2017-06-22 LAB — CYTOLOGY - PAP
DIAGNOSIS: NEGATIVE
HPV: NOT DETECTED

## 2017-06-25 ENCOUNTER — Ambulatory Visit (INDEPENDENT_AMBULATORY_CARE_PROVIDER_SITE_OTHER): Payer: Self-pay | Admitting: Internal Medicine

## 2017-06-25 DIAGNOSIS — N390 Urinary tract infection, site not specified: Secondary | ICD-10-CM

## 2017-06-25 DIAGNOSIS — Z87440 Personal history of urinary (tract) infections: Secondary | ICD-10-CM

## 2017-06-25 DIAGNOSIS — Z8349 Family history of other endocrine, nutritional and metabolic diseases: Secondary | ICD-10-CM

## 2017-06-25 DIAGNOSIS — Z8249 Family history of ischemic heart disease and other diseases of the circulatory system: Secondary | ICD-10-CM

## 2017-06-25 DIAGNOSIS — R300 Dysuria: Secondary | ICD-10-CM | POA: Insufficient documentation

## 2017-06-25 DIAGNOSIS — Z8489 Family history of other specified conditions: Secondary | ICD-10-CM

## 2017-06-25 DIAGNOSIS — R011 Cardiac murmur, unspecified: Secondary | ICD-10-CM

## 2017-06-25 DIAGNOSIS — Z833 Family history of diabetes mellitus: Secondary | ICD-10-CM

## 2017-06-25 LAB — POCT URINALYSIS DIPSTICK
Bilirubin, UA: NEGATIVE
Glucose, UA: NEGATIVE
KETONES UA: NEGATIVE
Nitrite, UA: NEGATIVE
PH UA: 7 (ref 5.0–8.0)
PROTEIN UA: NEGATIVE
SPEC GRAV UA: 1.02 (ref 1.010–1.025)
Urobilinogen, UA: 0.2 E.U./dL

## 2017-06-25 MED ORDER — CEPHALEXIN 500 MG PO TABS: 500.0000 mg | ORAL_TABLET | Freq: Two times a day (BID) | ORAL | 0 refills | Status: AC

## 2017-06-25 NOTE — Patient Instructions (Signed)
I have started an antibiotic called cephalexin for treatment of your uti.  You will take 1 tablet twice daily for five days.  If you do not feel like you are getting better, develop fever, or new pain please come back for further evaluation.

## 2017-06-25 NOTE — Assessment & Plan Note (Signed)
Patient's urinalysis mildly suggests UTI, she has classic symptoms of UTI Dysuria, pyuria, increased frequency and this is similar to the UTI she had 3 years ago.  She denies systemic symptoms and has no CVA tenderness on exam.    -treated empirically with Keflex 500 mg twice a day for 5 days -Urine culture acquired for further narrowing of spectrum if treatment failure occurs

## 2017-06-25 NOTE — Progress Notes (Signed)
   CC: dysuria, pyuria   HPI:  [name] is a 56 y.o. female who presents with 3 days of dysuria, pyuria, and increased urinary frequency.  She denies any systemic symptoms such as fevers, chills.  She is not sexually active reports no vaginal discharge or pruritus.  Please see A&P for status of the patient's chronic medical conditions  Past Medical History:  Diagnosis Date  . Hypertension    Review of Systems:  ROS: Pulmonary: pt denies increased work of breathing, shortness of breath,  Cardiac: pt denies palpitations, chest pain,  Abdominal: pt denies abdominal pain, nausea, vomiting, or diarrhea  Physical Exam:  Vitals:   06/25/17 1324  BP: 120/78  Pulse: (!) 59  Temp: 98.2 F (36.8 C)  TempSrc: Oral  SpO2: 100%  Weight: 130 lb 4.8 oz (59.1 kg)   Physical Exam  Constitutional: She is oriented to person, place, and time. No distress.  Cardiovascular: Normal rate and regular rhythm.  Exam reveals no gallop and no friction rub.   Murmur (2/6 systolic LLSB) heard. Pulmonary/Chest: Effort normal and breath sounds normal. No respiratory distress. She has no wheezes. She has no rales. She exhibits no tenderness.  Abdominal: Soft. Bowel sounds are normal. She exhibits no distension and no mass. There is no tenderness. There is no rebound and no guarding.  Neurological: She is alert and oriented to person, place, and time.  Skin: She is not diaphoretic.     Social History   Social History  . Marital status: Married    Spouse name: N/A  . Number of children: N/A  . Years of education: N/A   Occupational History  . Not on file.   Social History Main Topics  . Smoking status: Never Smoker  . Smokeless tobacco: Not on file  . Alcohol use No  . Drug use: No  . Sexual activity: No   Other Topics Concern  . Not on file   Social History Narrative  . No narrative on file    Family History  Problem Relation Age of Onset  . Heart disease Mother   . Diabetes  Mother   . Hypertension Mother   . Hyperlipidemia Mother   . Early death Father     Assessment & Plan:   See Encounters Tab for problem based charting.  Patient seen with Dr. Sandre Kitty

## 2017-06-25 NOTE — Progress Notes (Signed)
Internal Medicine Clinic Attending  I saw and evaluated the patient.  I personally confirmed the key portions of the history and exam documented by Dr. Frances Furbish and I reviewed pertinent patient test results.  The assessment, diagnosis, and plan were formulated together and I agree with the documentation in the resident's note.  Presenting today with symptoms of cystitis, although urine dip only has small LE and negative nitrites, will treat based on classic symptoms.  Anne Shutter, MD

## 2017-06-25 NOTE — Addendum Note (Signed)
Addended by: Anne Shutter on: 06/25/2017 05:25 PM   Modules accepted: Level of Service

## 2017-06-27 LAB — URINE CULTURE

## 2017-07-06 ENCOUNTER — Telehealth: Payer: Self-pay

## 2017-07-06 NOTE — Telephone Encounter (Signed)
Requesting to speak with a nurse about meds.  

## 2017-07-06 NOTE — Telephone Encounter (Signed)
Pt states she is no better from her last visit, appt made for 10/10 at [address]

## 2017-07-07 ENCOUNTER — Ambulatory Visit (INDEPENDENT_AMBULATORY_CARE_PROVIDER_SITE_OTHER): Payer: Self-pay | Admitting: Internal Medicine

## 2017-07-07 VITALS — BP 142/83 | HR 56 | Temp 98.1°F | Ht 62.0 in | Wt 130.0 lb

## 2017-07-07 DIAGNOSIS — R300 Dysuria: Secondary | ICD-10-CM

## 2017-07-07 DIAGNOSIS — I10 Essential (primary) hypertension: Secondary | ICD-10-CM

## 2017-07-07 DIAGNOSIS — Z79899 Other long term (current) drug therapy: Secondary | ICD-10-CM

## 2017-07-07 MED ORDER — LISINOPRIL 5 MG PO TABS: 10.0000 mg | ORAL_TABLET | Freq: Every day | ORAL | 0 refills | Status: DC

## 2017-07-07 MED ORDER — NITROFURANTOIN MONOHYD MACRO 100 MG PO CAPS: 100.0000 mg | ORAL_CAPSULE | Freq: Two times a day (BID) | ORAL | 0 refills | Status: AC

## 2017-07-07 NOTE — Progress Notes (Signed)
   CC: Follow up for uti  HPI:  [name] is a 56 y.o.  with pmh of htn and de quervains tenosynovitis who presented with urinary problems.    Past Medical History:  Diagnosis Date  . Hypertension    Review of Systems:    Review of Systems  Constitutional: Negative for chills and fever.  Cardiovascular: Negative for chest pain and palpitations.  Gastrointestinal: Positive for abdominal pain (suprapubic pain). Negative for nausea and vomiting.  Genitourinary: Positive for dysuria, flank pain, frequency and urgency. Negative for hematuria.  Musculoskeletal: Negative for myalgias.  Neurological: Negative for dizziness and headaches.     Physical Exam:  Vitals:   07/07/17 1350 07/07/17 1354  BP: (!) 143/87   Pulse: (!) 58   Temp: 98.1 F (36.7 C)   TempSrc: Oral   SpO2: 100%   Weight: 130 lb (59 kg)   Height:   (1.575 m)   Physical Exam  Constitutional: She appears well-developed. No distress.  HENT:  Head: Normocephalic and atraumatic.  Cardiovascular: Normal rate, regular rhythm and normal heart sounds.   Pulmonary/Chest: Effort normal and breath sounds normal. No respiratory distress. She has no wheezes.  Abdominal: Soft. Bowel sounds are normal. She exhibits no distension. There is no tenderness.  Genitourinary:  Genitourinary Comments: No costovertebral tenderness noted   Psychiatric: She has a normal mood and affect. Her behavior is normal. Judgment and thought content normal.    Assessment & Plan:   See Encounters Tab for problem based charting.  Patient seen with Dr. Oswaldo Done

## 2017-07-07 NOTE — Assessment & Plan Note (Signed)
The patient's bp during this visit was 143/87 and repeat was still elevated at 142/83. The patient is currently on lisinopril  qd. The patient has continued to have systolic blood pressures in the 140's over the course of this year despite being on therapy.   -Increased lisinopril  qd to  qd

## 2017-07-07 NOTE — Patient Instructions (Addendum)
It was a pleasure to meet you today. I will be your new doctor. During your visit we spoke about your urinary tract infection.   -Please take nitrofurantoin  twice a day for 3 days.   Your blood pressure was elevated during this visit so your blood pressure medication has been increased.   -Please take lisinopril  daily -Follow up in 1 month  Please let me know if you have any questions. I look forward to working with you on your health!  Best,  Lorenso Courier, MD Internal Medicine PGY1

## 2017-07-07 NOTE — Assessment & Plan Note (Signed)
The patient presents with dysuria for the past 2 days, burning, and frequently. She states that there is no change in color of her urine, non bloody, no discharge in the urine. She reports back pain on both sides that gets worse when she sleeps and when she bends down.   The patient was seen on 06/25/17 and treated with keflex  bid for 5 days. The patient states that she took the medication but she continued to have the symptom after she finished the dose of medication. Urine culture obtained last visit was positive for E. Coli tnat was not resistant to any antibiotics.   The patient is likely to not have had sufficient duration of therapy for her uti.   -Prescribed nitrofurantoin  tid for 3 days

## 2017-07-08 LAB — URINALYSIS, COMPLETE
Bilirubin, UA: NEGATIVE
Glucose, UA: NEGATIVE
Ketones, UA: NEGATIVE
Nitrite, UA: NEGATIVE
PH UA: 6.5 (ref 5.0–7.5)
Protein, UA: NEGATIVE
Specific Gravity, UA: <=1.005 — AB (ref 1.005–1.030)
Urobilinogen, Ur: 0.2 mg/dL (ref 0.2–1.0)

## 2017-07-08 LAB — MICROSCOPIC EXAMINATION: Casts: NONE SEEN /lpf

## 2017-07-08 NOTE — Progress Notes (Signed)
Internal Medicine Clinic Attending  I saw and evaluated the patient.  I personally confirmed the key portions of the history and exam documented by Dr. Chundi and I reviewed pertinent patient test results.  The assessment, diagnosis, and plan were formulated together and I agree with the documentation in the resident's note. 

## 2017-07-11 LAB — URINE CULTURE

## 2017-07-16 ENCOUNTER — Ambulatory Visit (INDEPENDENT_AMBULATORY_CARE_PROVIDER_SITE_OTHER): Payer: Self-pay | Admitting: Internal Medicine

## 2017-07-16 VITALS — BP 122/79 | HR 66 | Temp 98.1°F | Ht 62.0 in | Wt 129.8 lb

## 2017-07-16 DIAGNOSIS — I10 Essential (primary) hypertension: Secondary | ICD-10-CM

## 2017-07-16 DIAGNOSIS — M545 Low back pain: Secondary | ICD-10-CM

## 2017-07-16 DIAGNOSIS — Z87440 Personal history of urinary (tract) infections: Secondary | ICD-10-CM

## 2017-07-16 DIAGNOSIS — Z79899 Other long term (current) drug therapy: Secondary | ICD-10-CM

## 2017-07-16 DIAGNOSIS — S39012A Strain of muscle, fascia and tendon of lower back, initial encounter: Secondary | ICD-10-CM | POA: Insufficient documentation

## 2017-07-16 DIAGNOSIS — Z9089 Acquired absence of other organs: Secondary | ICD-10-CM

## 2017-07-16 DIAGNOSIS — Z8719 Personal history of other diseases of the digestive system: Secondary | ICD-10-CM

## 2017-07-16 MED ORDER — IBUPROFEN 800 MG PO TABS: 800.0000 mg | ORAL_TABLET | Freq: Three times a day (TID) | ORAL | 0 refills | Status: AC | PRN

## 2017-07-16 NOTE — Patient Instructions (Signed)
It was a pleasure to see you Michele Webb.  It sounds like you strained your lower back muscle when you lifted the bag of rice. I do not think your pain is from your gallbladder.  Please continue to use your pain cream and heating pad as needed.  Try Ibuprofen 800 mg every 8 hours only as needed over the next week.  In the future, lift with your legs and not with your back.  If your cough is bothering you, we can try switching your Lisinopril in the future.  Please follow up with Dr. Delma Officerhundi as scheduled or see us sooner if needed.   Low Back Sprain A sprain is a stretch or tear in the bands of tissue that hold bones and joints together (ligaments). Sprains of the lower back (lumbar spine) are a common cause of low back pain. A sprain occurs when ligaments are overextended or stretched beyond their limits. The ligaments can become inflamed, resulting in pain and sudden muscle tightening (spasms). A sprain can be caused by an injury (trauma), or it can develop gradually due to overuse. There are three types of sprains:  Grade 1 is a mild sprain involving an overstretched ligament or a very slight tear of the ligament.  Grade 2 is a moderate sprain involving a partial tear of the ligament.  Grade 3 is a severe sprain involving a complete tear of the ligament.  What are the causes? This condition may be caused by:  Trauma, such as a fall or a hit to the body.  Twisting or overstretching the back. This may result from doing activities that require a lot of energy, such as lifting heavy objects.  What increases the risk? The following factors may increase your risk of getting this condition:  Playing contact sports.  Participating in sports or activities that put excessive stress on the back and require a lot of bending and twisting, including: ? Lifting weights or heavy objects. ? Gymnastics. ? Soccer. ? Figure skating. ? Snowboarding.  Being overweight or obese.  Having poor  strength and flexibility.  What are the signs or symptoms? Symptoms of this condition may include:  Sharp or dull pain in the lower back that does not go away. Pain may extend to the buttocks.  Stiffness.  Limited range of motion.  Inability to stand up straight due to stiffness or pain.  Muscle spasms.  How is this diagnosed?  This condition may be diagnosed based on:  Your symptoms.  Your medical history.  A physical exam. ? Your health care provider may push on certain areas of your back to determine the source of your pain. ? You may be asked to bend forward, backward, and side to side to assess the severity of your pain and your range of motion.  Imaging tests, such as: ? X-rays. ? MRI.  How is this treated? Treatment for this condition may include:  Applying heat and cold to the affected area.  Medicines to help relieve pain and to relax your muscles (muscle relaxants).  NSAIDs to help reduce swelling and discomfort.  Physical therapy.  When your symptoms improve, it is important to gradually return to your normal routine as soon as possible to reduce pain, avoid stiffness, and avoid loss of muscle strength. Generally, symptoms should improve within 6 weeks of treatment. However, recovery time varies. Follow these instructions at home: Managing pain, stiffness, and swelling  If directed, apply ice to the injured area during the first 24 hours  after your injury. ? Put ice in a plastic bag. ? Place a towel between your skin and the bag. ? Leave the ice on for 20 minutes, 2-3 times a day.  If directed, apply heat to the affected area as often as told by your health care provider. Use the heat source that your health care provider recommends, such as a moist heat pack or a heating pad. ? Place a towel between your skin and the heat source. ? Leave the heat on for 20-30 minutes. ? Remove the heat if your skin turns bright red. This is especially important if you  are unable to feel pain, heat, or cold. You may have a greater risk of getting burned. Activity  Rest and return to your normal activities as told by your health care provider. Ask your health care provider what activities are safe for you.  Avoid activities that take a lot of effort (are strenuous) for as long as told by your health care provider.  Do exercises as told by your health care provider. General instructions   Take over-the-counter and prescription medicines only as told by your health care provider.  If you have questions or concerns about safety while taking pain medicine, talk with your health care provider.  Do not drive or operate heavy machinery until you know how your pain medicine affects you.  Do not use any tobacco products, such as cigarettes, chewing tobacco, and e-cigarettes. Tobacco can delay bone healing. If you need help quitting, ask your health care provider.  Keep all follow-up visits as told by your health care provider. This is important. How is this prevented?  Warm up and stretch before being active.  Cool down and stretch after being active.  Give your body time to rest between periods of activity.  Avoid: ? Being physically inactive for long periods at a time. ? Exercising or playing sports when you are tired or in pain.  Use correct form when playing sports and lifting heavy objects.  Use good posture when sitting and standing.  Maintain a healthy weight.  Sleep on a mattress with medium firmness to support your back.  Make sure to use equipment that fits you, including shoes that fit well.  Be safe and responsible while being active to avoid falls.  Do at least 150 minutes of moderate-intensity exercise each week, such as brisk walking or water aerobics. Try a form of exercise that takes stress off your back, such as swimming or stationary cycling.  Maintain physical fitness, including: ? Strength. In particular, develop and  maintain strong abdominal muscles. ? Flexibility. ? Cardiovascular fitness. ? Endurance. Contact a health care provider if:  Your back pain does not improve after 6 weeks of treatment.  Your symptoms get worse. Get help right away if:  Your back pain is severe.  You are unable to stand or walk.  You develop pain in your legs.  You develop weakness in your buttocks or legs.  You have difficulty controlling when you urinate or when you have a bowel movement. This information is not intended to replace advice given to you by your health care provider. Make sure you discuss any questions you have with your health care provider. Document Released: 09/14/2005 Document Revised: 05/21/2016 Document Reviewed: 06/26/2015 Elsevier Interactive Patient Education  [employer].

## 2017-07-16 NOTE — Assessment & Plan Note (Addendum)
Patient's symptoms are consistent with lumbar strain. She has no alarm symptoms suspicious for spinal cord compression or cauda equina syndrome. She did have a recent UTI which has been appropriately treated and she is currently asymptomatic from that standpoint. She does not have CVA tenderness or symptoms consistent with nephrolithiasis. She does have a history of cholelithiasis seen on prior CT imaging, however she does not have any RUQ tenderness to suggest cholecystitis or epigastric/band-like back pain to suggest gallstone pancreatitis. She has found relief with her pain creme, heating pads, and tylenol - Continue conservative management - Can use short course of Ibuprofen 800 mg q8h prn

## 2017-07-16 NOTE — Assessment & Plan Note (Signed)
BP Readings from Last 3 Encounters:  07/16/17 122/79  07/07/17 (!) 142/83  06/25/17 120/78   Her BP is well-controlled. She does report a new dry cough since starting her Lisinopril. I offered making a switch, however she is not really bothered by her cough and would like to think about this. - Continue Lisinopril 10 mg daily - consider switch to ARB if cough persists and is bothersome

## 2017-07-16 NOTE — Progress Notes (Signed)
CC: back pain  HPI:  [name] is a 56 y.o. female with PMH of HTN, Appendicitis s/p appendectomy (2016), Cholelithiasis who presents for evaluation of low back pain.  Lumbar pain on left side: Patient reports 2 days of left lower back pain described as dull and achy after lifting a bag of rice. Pain improved with the use of a Chinese pain cream, heating pad, and Tylenol. She has some recurrence of her pain last night. She is concerned about gallbladder issues as she was told she had a stone 2 years ago when her appendix was removed. She denies any RUQ or epigastric pain. No bandlike back pain, it is only on her left flank. She denies any fevers, chills, N/V/D/C, focal weakness, or incontinence. She was recently treated for E coli UTI and denies any further urinary symptoms including dysuria or hematuria.  HTN: She was started on Lisinopril 5 mg last month which was increased to 10 mg 9 days ago. She reports a new dry cough since starting Lisinopril that is not bothersome. BP is 122/79 today.   Past Medical History:  Diagnosis Date  . Hypertension    Review of Systems:   Review of Systems  Constitutional: Negative for chills and fever.  Respiratory: Positive for cough. Negative for sputum production and shortness of breath.   Cardiovascular: Negative for chest pain.  Gastrointestinal: Negative for abdominal pain, constipation, diarrhea, nausea and vomiting.  Genitourinary: Negative for dysuria and hematuria.  Musculoskeletal: Positive for back pain. Negative for falls.  Neurological: Negative for sensory change, focal weakness and loss of consciousness.     Physical Exam:  Vitals:   07/16/17 1353  BP: 122/79  Pulse: 66  Temp: 98.1 F (36.7 C)  TempSrc: Oral  SpO2: 100%  Weight: 129 lb 12.8 oz (58.9 kg)  Height: 5\' 2"  (1.575 m)   Physical Exam  Constitutional: She is oriented to person, place, and time. She appears well-developed and well-nourished. No distress.    Cardiovascular: Normal rate and regular rhythm.   No murmur heard. Pulmonary/Chest: Effort normal. No respiratory distress. She has no wheezes. She has no rales.  Abdominal: Soft. She exhibits no distension and no mass. There is no tenderness. There is no guarding and no CVA tenderness.  Musculoskeletal: Normal range of motion. She exhibits no edema or tenderness.       Cervical back: She exhibits no tenderness.       Thoracic back: She exhibits no tenderness.       Lumbar back: She exhibits no tenderness.  Strength 5/5 b/l LEs.  Neurological: She is alert and oriented to person, place, and time.  No bowel/bladder incontinence.  Skin: Skin is warm. She is not diaphoretic.  Psychiatric: She has a normal mood and affect.    Assessment & Plan:   See Encounters Tab for problem based charting.  Patient discussed with Dr. Shelia Mediaaines  Lumbar strain, initial encounter Patient's symptoms are consistent with lumbar strain. She has no alarm symptoms suspicious for spinal cord compression or cauda equina syndrome. She did have a recent UTI which has been appropriately treated and she is currently asymptomatic from that standpoint. She does not have CVA tenderness or symptoms consistent with nephrolithiasis. She does have a history of cholelithiasis seen on prior CT imaging, however she does not have any RUQ tenderness to suggest cholecystitis or epigastric/band-like back pain to suggest gallstone pancreatitis. She has found relief with her pain creme, heating pads, and tylenol - Continue conservative management -  Can use short course of Ibuprofen 800 mg q8h prn  HTN (hypertension) BP Readings from Last 3 Encounters:  07/16/17 122/79  07/07/17 (!) 142/83  06/25/17 120/78   Her BP is well-controlled. She does report a new dry cough since starting her Lisinopril. I offered making a switch, however she is not really bothered by her cough and would like to think about this. - Continue Lisinopril 10 mg  daily - consider switch to ARB if cough persists and is bothersome

## 2017-07-20 NOTE — Progress Notes (Signed)
Internal Medicine Clinic Attending  Case discussed with Dr. Patel  at the time of the visit.  We reviewed the resident's history and exam and pertinent patient test results.  I agree with the assessment, diagnosis, and plan of care documented in the resident's note.  Alexander N Raines, MD   

## 2017-08-05 ENCOUNTER — Ambulatory Visit (INDEPENDENT_AMBULATORY_CARE_PROVIDER_SITE_OTHER): Payer: Self-pay | Admitting: Internal Medicine

## 2017-08-05 VITALS — BP 137/84 | HR 67 | Temp 97.8°F | Wt 128.0 lb

## 2017-08-05 DIAGNOSIS — I10 Essential (primary) hypertension: Secondary | ICD-10-CM

## 2017-08-05 MED ORDER — HYDROCHLOROTHIAZIDE 25 MG PO TABS: 25.0000 mg | ORAL_TABLET | Freq: Every day | ORAL | 1 refills | Status: DC

## 2017-08-05 NOTE — Patient Instructions (Addendum)
I sent a prescription to [facility] for hydrochlorothizide (HCTZ) 25mg  once daily. Take this instead of the lisinopril for high blood pressure. We would like to see you back later this month for a blood test.

## 2017-08-05 NOTE — Progress Notes (Signed)
   CC: Cough after starting blood pressure medicine  HPI:  [name] is a 56 y.o. female with PMHx detailed below presenting with cough since starting lisinopril.  See problem based assessment and plan below for additional details.  HTN (hypertension) She has had a night time dry cough since early October, shortly after starting lisinopril for hypertension. She has never had a persistent cough for over a month before. She thinks it is related to the medication because she has no other systemic symptoms or congestion. She stopped taking this medication 2 days ago after deciding to come for evaluation. A: ACE-I associated persistent cough for a month. She does not have diabetes or renal disease as a string indication for needing an ACE-I/ARB as first line for hypertension. I think starting her on a moderate dose of a thiazide diuretic is reasonable with blood pressure of 140. She is a postmenopausal woman but otherwise not at exceptional risk for hyponatremia or complications. P: Start HCTZ 25mg  daily Needs RTC in 1-2 weeks for Bmet    Past Medical History:  Diagnosis Date  . Hypertension     Review of Systems: Review of Systems  Constitutional: Negative for chills and fever.  HENT: Negative for congestion and sore throat.   Respiratory: Positive for cough. Negative for sputum production and shortness of breath.   Cardiovascular: Negative for chest pain.  Skin: Negative for rash.     Physical Exam: Vitals:   08/05/17 1534  BP: 137/84  Pulse: 67  Temp: 97.8 F (36.6 C)  TempSrc: Oral  SpO2: 100%  Weight: 128 lb (58.1 kg)   GENERAL- alert, co-operative, NAD HEENT- No oropharyngeal erythema or edema, no cervical lymphadenopathy CARDIAC- RRR, no murmurs, rubs or gallops. RESP- CTAB, no wheezes or crackles. EXTREMITIES- No pedal edema. SKIN- Warm, dry, No rash or lesion.   Assessment & Plan:   See encounters tab for problem based medical decision  making.   Patient discussed with Dr. Oswaldo DoneVincent

## 2017-08-09 NOTE — Assessment & Plan Note (Signed)
She has had a night time dry cough since early October, shortly after starting lisinopril for hypertension. She has never had a persistent cough for over a month before. She thinks it is related to the medication because she has no other systemic symptoms or congestion. She stopped taking this medication 2 days ago after deciding to come for evaluation. A: ACE-I associated persistent cough for a month. She does not have diabetes or renal disease as a string indication for needing an ACE-I/ARB as first line for hypertension. I think starting her on a moderate dose of a thiazide diuretic is reasonable with blood pressure of 140. She is a postmenopausal woman but otherwise not at exceptional risk for hyponatremia or complications. P: Start HCTZ 25mg  daily Needs RTC in 1-2 weeks for Bmet

## 2017-08-09 NOTE — Progress Notes (Signed)
Internal Medicine Clinic Attending  Case discussed with Dr. Rice at the time of the visit.  We reviewed the resident's history and exam and pertinent patient test results.  I agree with the assessment, diagnosis, and plan of care documented in the resident's note.  

## 2017-08-13 ENCOUNTER — Ambulatory Visit: Payer: Self-pay

## 2017-08-13 ENCOUNTER — Encounter: Payer: Self-pay | Admitting: Internal Medicine

## 2017-08-23 ENCOUNTER — Ambulatory Visit: Payer: Self-pay

## 2017-09-02 ENCOUNTER — Ambulatory Visit: Payer: Self-pay

## 2017-09-23 ENCOUNTER — Ambulatory Visit: Payer: Self-pay

## 2017-09-30 NOTE — Progress Notes (Deleted)
   CC: ***  HPI:  [name] is a 57 y.o.   Hypertension The patient's blood pressure during this visit was ***. The patient is currently being prescribed hctz 25mg  qd. The patient was switched to hctz in Novermber 2018 after she developed a ACE-I associated cough. The patient was told to return to clinic in 1-2weeks to check her bmp for possible hyponatremia complications form hctz. However, the patient did not follow up.   -BMP ordered    Health Maintenance  -TDAP and influenza vaccines -Mammogram referral  Past Medical History:  Diagnosis Date  . Hypertension    Review of Systems:  ***  Physical Exam:  There were no vitals filed for this visit. ***  Assessment & Plan:   See Encounters Tab for problem based charting.  Patient {GC/GE:[id number]::"discussed with","seen with"} Dr. {NAMES:[id number]::"Butcher","Granfortuna","E. Hoffman","Klima","Mullen","Narendra","Raines","Vincent"}

## 2017-10-01 ENCOUNTER — Encounter: Payer: Self-pay | Admitting: Internal Medicine

## 2017-11-12 ENCOUNTER — Ambulatory Visit: Payer: Self-pay

## 2017-11-12 ENCOUNTER — Encounter: Payer: Self-pay | Admitting: Internal Medicine

## 2017-11-12 ENCOUNTER — Ambulatory Visit (INDEPENDENT_AMBULATORY_CARE_PROVIDER_SITE_OTHER): Payer: Self-pay | Admitting: Internal Medicine

## 2017-11-12 VITALS — BP 144/83 | HR 62 | Temp 98.1°F | Ht 62.0 in | Wt 125.3 lb

## 2017-11-12 DIAGNOSIS — S39012A Strain of muscle, fascia and tendon of lower back, initial encounter: Secondary | ICD-10-CM

## 2017-11-12 DIAGNOSIS — I10 Essential (primary) hypertension: Secondary | ICD-10-CM

## 2017-11-12 DIAGNOSIS — Z79899 Other long term (current) drug therapy: Secondary | ICD-10-CM

## 2017-11-12 DIAGNOSIS — M545 Low back pain: Secondary | ICD-10-CM

## 2017-11-12 DIAGNOSIS — M654 Radial styloid tenosynovitis [de Quervain]: Secondary | ICD-10-CM

## 2017-11-12 DIAGNOSIS — Z23 Encounter for immunization: Secondary | ICD-10-CM

## 2017-11-12 DIAGNOSIS — Z0000 Encounter for general adult medical examination without abnormal findings: Secondary | ICD-10-CM

## 2017-11-12 MED ORDER — AMLODIPINE BESYLATE 5 MG PO TABS: 5.0000 mg | ORAL_TABLET | Freq: Every day | ORAL | 1 refills | Status: DC

## 2017-11-12 NOTE — Assessment & Plan Note (Signed)
Was given influenza and Tdap vaccine -Mammogram ordered -Colonoscopy ordered

## 2017-11-12 NOTE — Assessment & Plan Note (Signed)
The patient's stated that she has been having lower back pain for approximately 3 years. The last note documented about the patient's back pain was in October 2018 when she states that her back pain got worse after lifting a bag of rice.   Today the patient notes the back pain that is 3/10 intensity. The patient states that she notes the back pain comes on after she lays flat for 4-5 hours. The patient does not have fecal incontinence, saddle anesthesia, or numbness or tingling in the extremities.  Plan The patient is likely to have a lumbar sprain and she could benefit from change/rotating her mattress.   -Spoke to the patient about getting firm mattress as it will help with back support.  -Referred patient to physical therapy

## 2017-11-12 NOTE — Patient Instructions (Signed)
It was a pleasure to see you today Ms. Canepa. Please make the following changes:  -Please get colonoscopy done -Please get mammogram done  -Please go to physical therapy for back pain and try changing mattress  If you have any questions or concerns, please call our clinic at [phone] between 9am-5pm and after hours call [phone] and ask for the internal medicine resident on call. If you feel you are having a medical emergency please call 911.   Thank you, we look forward to help you remain healthy!  Lorenso CourierVahini Pinchus Weckwerth, MD Internal Medicine PGY1

## 2017-11-12 NOTE — Progress Notes (Signed)
   WG:[id number]:Hypertension follow up  HPI:  [name] is a 57 y.o. with hypertension, left hand deQuervain tenosynovitis who presents for blood pressure follow up. Please see problem based charting for evaluation, assessment, and plan.  Past Medical History:  Diagnosis Date  . Hypertension    Review of Systems:  denies headaches, sob, dizziness, and chest pain  Physical Exam:  Vitals:   11/12/17 1503  BP: (!) 144/83  Pulse: 62  Temp: 98.1 F (36.7 C)  TempSrc: Oral  SpO2: 99%  Weight: 125 lb 4.8 oz (56.8 kg)  Height: 5\' 2"  (1.575 m)   Physical Exam  Constitutional: She appears well-developed and well-nourished. No distress.  HENT:  Head: Normocephalic and atraumatic.  Eyes: Conjunctivae are normal.  Cardiovascular: Normal rate, regular rhythm and normal heart sounds.  Respiratory: Effort normal and breath sounds normal. No respiratory distress. She has no wheezes.  GI: Soft. Bowel sounds are normal. She exhibits no distension. There is no tenderness.  Musculoskeletal: Normal range of motion. She exhibits no tenderness (no tenderness to palpation in the lumbar spine).  Good rom at hip  Neurological: She is alert.  Skin: She is not diaphoretic. No erythema.  Psychiatric: She has a normal mood and affect. Her behavior is normal. Judgment and thought content normal.    Assessment & Plan:   See Encounters Tab for problem based charting.  Patient discussed with Dr. Heide SparkNarendra

## 2017-11-12 NOTE — Assessment & Plan Note (Addendum)
Assessment The patient's blood pressure today was 144/83. The patient was prescribed hctz 25mg  qd as she could not tolerate lisinopril due to cough. However, the patient stated that she has stopped using hctz 1-2 months ago as she felt her cough come back and thought it might be due to hctz. She also had dry mouth and states that she has been urinating excessively.   Since the patient's blood pressure is still elevated will start the patient on amlodipine 5mg  qd.   Plan -Stop HCTZ -Start amlodipine 5mg  qd

## 2017-11-15 NOTE — Progress Notes (Signed)
Internal Medicine Clinic Attending  Case discussed with Dr. Chundi at the time of the visit.  We reviewed the resident's history and exam and pertinent patient test results.  I agree with the assessment, diagnosis, and plan of care documented in the resident's note. 

## 2017-11-23 ENCOUNTER — Encounter: Payer: Self-pay | Admitting: *Deleted

## 2017-11-23 NOTE — Addendum Note (Signed)
Addended by: Neomia DearPOWERS, Roquel Burgin E on: 11/23/2017 12:49 PM   Modules accepted: Orders

## 2017-11-25 ENCOUNTER — Other Ambulatory Visit: Payer: Self-pay | Admitting: Internal Medicine

## 2017-11-25 DIAGNOSIS — Z1231 Encounter for screening mammogram for malignant neoplasm of breast: Secondary | ICD-10-CM

## 2017-12-15 ENCOUNTER — Encounter: Payer: Self-pay | Admitting: Internal Medicine

## 2017-12-22 NOTE — Addendum Note (Signed)
Addended by: Neomia DearPOWERS, Tyrika Newman E on: 12/22/2017 06:40 PM   Modules accepted: Orders

## 2018-03-21 ENCOUNTER — Other Ambulatory Visit: Payer: Self-pay | Admitting: Internal Medicine

## 2018-03-22 NOTE — Telephone Encounter (Signed)
Please schedule a follow up visit for patient as amlodipine was started new during previous office visit and I want to make sure that her blood pressure is well controlled. Have accepted refill request for 30 days in meantime. Thank you!

## 2018-04-15 ENCOUNTER — Encounter: Payer: Self-pay | Admitting: Internal Medicine

## 2018-04-15 ENCOUNTER — Ambulatory Visit: Payer: Medicaid Other

## 2018-12-01 ENCOUNTER — Ambulatory Visit (INDEPENDENT_AMBULATORY_CARE_PROVIDER_SITE_OTHER): Payer: Self-pay | Admitting: Internal Medicine

## 2018-12-01 ENCOUNTER — Other Ambulatory Visit: Payer: Self-pay

## 2018-12-01 VITALS — BP 151/90 | HR 78 | Temp 99.1°F | Ht 62.0 in | Wt 127.8 lb

## 2018-12-01 DIAGNOSIS — Z79899 Other long term (current) drug therapy: Secondary | ICD-10-CM

## 2018-12-01 DIAGNOSIS — I10 Essential (primary) hypertension: Secondary | ICD-10-CM

## 2018-12-01 DIAGNOSIS — J069 Acute upper respiratory infection, unspecified: Secondary | ICD-10-CM | POA: Insufficient documentation

## 2018-12-01 DIAGNOSIS — R509 Fever, unspecified: Secondary | ICD-10-CM

## 2018-12-01 DIAGNOSIS — R05 Cough: Secondary | ICD-10-CM

## 2018-12-01 MED ORDER — BENZONATATE 100 MG PO CAPS: 100.0000 mg | ORAL_CAPSULE | Freq: Four times a day (QID) | ORAL | 0 refills | Status: AC | PRN

## 2018-12-01 MED ORDER — AMLODIPINE BESYLATE 5 MG PO TABS: 5.0000 mg | ORAL_TABLET | Freq: Every day | ORAL | 0 refills | Status: DC

## 2018-12-01 NOTE — Patient Instructions (Addendum)
It was a pleasure to see you today Ms. Huttner. Please make the following changes:  -please make sure that you are taking your blood pressure medication-amlodipine 5mg  daily  -tessalon pearls 100mg  daily for your cough  -Please make sure to hydrate yourself well and get plenty of rest  If you have any questions or concerns, please call our clinic at [phone] between 9am-5pm and after hours call [phone] and ask for the internal medicine resident on call. If you feel you are having a medical emergency please call 911.   Thank you, we look forward to help you remain healthy!  Lorenso Courier, MD Internal Medicine PGY2

## 2018-12-01 NOTE — Assessment & Plan Note (Signed)
Patient states that she has been having a productive cough for the past 3 weeks. The sputum is green in color. She has been having accompanied subjective fevers and chills. She denies headaches, nausea, vomiting, sore throat, abdominal pain, myalgias.  Has not had any recent travel and no sick contacts that she knows of. She has used over the counter cough medication.   Assessment and plan  Patient is likely to have an upper respiratory infection. Treated symptomatically with tessalon pearls for cough suppression, rest and hydration.

## 2018-12-01 NOTE — Progress Notes (Signed)
   CC: Cough  HPI:  Ms.Mennie Shumski is a 58 y.o. female with hypertension who presents for evaluation of cough. Please see problem based charting for evaluation, assessment, and plan.  Past Medical History:  Diagnosis Date  . Hypertension    Review of Systems:    Review of Systems  Constitutional: Positive for fever.  Respiratory: Positive for cough. Negative for shortness of breath.   Cardiovascular: Negative for chest pain.  Gastrointestinal: Negative for abdominal pain, diarrhea, nausea and vomiting.  Musculoskeletal: Negative for myalgias.  Neurological: Negative for dizziness and headaches.   Physical Exam:  Vitals:   12/01/18 1509  Weight: 127 lb 12.8 oz (58 kg)  Height: 5\' 2"  (1.575 m)   Physical Exam  Constitutional: Appears well-developed and well-nourished. No distress.  HENT: no anterior or posterior cervical lymphadenopathy, no posterior pharyngeal erythema or exudate, no maxillary ethymoid or frontal sinus tenderness Head: Normocephalic and atraumatic.  Eyes: Conjunctivae are normal.  Cardiovascular: Normal rate, regular rhythm and normal heart sounds.  Respiratory: Effort normal and breath sounds normal. No respiratory distress. No wheezes.  GI: Soft. Bowel sounds are normal. No distension. There is no tenderness.  Musculoskeletal: No edema.  Neurological: Is alert.  Skin: Not diaphoretic. No erythema.  Psychiatric: Normal mood and affect. Behavior is normal. Judgment and thought content normal.    Assessment & Plan:   See Encounters Tab for problem based charting.  Patient discussed with Dr. Heide Spark

## 2018-12-01 NOTE — Assessment & Plan Note (Signed)
The patient's blood pressure during this visit was 151/90. The patient is supposed to be taking amlodipine 5mg  qd, but she states that she has not taken it in several months. His last blood pressure visits are   BP Readings from Last 3 Encounters:  12/01/18 (!) 151/90  11/12/17 (!) 144/83  08/05/17 137/84   Assessment and Plan Emphasized the importance of adhering to medication regimen. Refilled amlodipine 5mg  qd.

## 2018-12-01 NOTE — Progress Notes (Deleted)
Patient states that she has been having a productive cough for the past 3 weeks. The sputum is green in color. She has been having accompanied subjective fevers and chills. She denies headaches, nausea, vomiting, sore throat, abdominal pain, myalgias.  Has not had any recent travel and no sick contacts that she knows of. She has used over the counter cough medication.

## 2018-12-02 LAB — GLUCOSE, CAPILLARY: Glucose-Capillary: 88 mg/dL (ref 70–99)

## 2018-12-02 NOTE — Progress Notes (Signed)
Internal Medicine Clinic Attending  Case discussed with Dr. Chundi at the time of the visit.  We reviewed the resident's history and exam and pertinent patient test results.  I agree with the assessment, diagnosis, and plan of care documented in the resident's note. 

## 2018-12-21 ENCOUNTER — Ambulatory Visit: Payer: Medicaid Other

## 2021-10-05 ENCOUNTER — Other Ambulatory Visit: Payer: Self-pay

## 2021-10-05 ENCOUNTER — Emergency Department (HOSPITAL_COMMUNITY)
Admission: EM | Admit: 2021-10-05 | Discharge: 2021-10-06 | Disposition: A | Discharge status: Home / Self Care | Payer: Medicaid Other | Attending: Emergency Medicine | Admitting: Emergency Medicine

## 2021-10-05 ENCOUNTER — Emergency Department (HOSPITAL_COMMUNITY): Payer: Medicaid Other

## 2021-10-05 DIAGNOSIS — Z79899 Other long term (current) drug therapy: Secondary | ICD-10-CM | POA: Insufficient documentation

## 2021-10-05 DIAGNOSIS — I10 Essential (primary) hypertension: Secondary | ICD-10-CM | POA: Insufficient documentation

## 2021-10-05 DIAGNOSIS — M545 Low back pain, unspecified: Secondary | ICD-10-CM

## 2021-10-05 NOTE — ED Provider Triage Note (Signed)
Emergency Medicine Provider Triage Evaluation Note  Michele Webb , a 61 y.o. female  was evaluated in triage.  Pt complains of left back/flank pain x 3 days.  Pain worse at night, trouble getting comfortable but does seem better during the day.  No reported urinary symptoms or fever.  No nausea/vomiting.  Review of Systems  Positive: Back/flank pain Negative: fever  Physical Exam  BP (!) 172/109 (BP Location: Right Arm)    Pulse 75    Temp 98.4 F (36.9 C) (Oral)    Resp 17    Ht 5\' 2"  (1.575 m)    Wt 58.5 kg    LMP 03/01/2012    SpO2 95%    BMI 23.59 kg/m   Gen:   Awake, no distress   Resp:  Normal effort  MSK:   Moves extremities without difficulty  Other:    Medical Decision Making  Medically screening exam initiated at 11:02 PM.  Appropriate orders placed.  [name] was informed that the remainder of the evaluation will be completed by another provider, this initial triage assessment does not replace that evaluation, and the importance of remaining in the ED until their evaluation is complete.  Left flank/back pain.  Labs, UA, CT renal stone study.   Larene Pickett, PA-C 10/05/21 2304

## 2021-10-05 NOTE — ED Triage Notes (Signed)
Pt c/o back pain x 3 days

## 2021-10-06 LAB — BASIC METABOLIC PANEL
Anion gap: 10 (ref 5–15)
BUN: 12 mg/dL (ref 8–23)
CO2: 27 mmol/L (ref 22–32)
Calcium: 9.6 mg/dL (ref 8.9–10.3)
Chloride: 103 mmol/L (ref 98–111)
Creatinine, Ser: 0.52 mg/dL (ref 0.44–1.00)
GFR, Estimated: >60 mL/min (ref >60)
Glucose, Bld: 103 mg/dL — ABNORMAL HIGH (ref 70–99)
Potassium: 4.2 mmol/L (ref 3.5–5.1)
Sodium: 140 mmol/L (ref 135–145)

## 2021-10-06 LAB — CBC WITH DIFFERENTIAL/PLATELET
Abs Immature Granulocytes: 0.01 10*3/uL (ref 0.00–0.07)
Basophils Absolute: 0 10*3/uL (ref 0.0–0.1)
Basophils Relative: 1 %
Eosinophils Absolute: 0.1 10*3/uL (ref 0.0–0.5)
Eosinophils Relative: 1 %
HCT: 42.5 % (ref 36.0–46.0)
Hemoglobin: 13.9 g/dL (ref 12.0–15.0)
Immature Granulocytes: 0 %
Lymphocytes Relative: 30 %
Lymphs Abs: 1.8 10*3/uL (ref 0.7–4.0)
MCH: 30.5 pg (ref 26.0–34.0)
MCHC: 32.7 g/dL (ref 30.0–36.0)
MCV: 93.2 fL (ref 80.0–100.0)
Monocytes Absolute: 0.3 10*3/uL (ref 0.1–1.0)
Monocytes Relative: 5 %
Neutro Abs: 3.8 10*3/uL (ref 1.7–7.7)
Neutrophils Relative %: 63 %
Platelets: 248 10*3/uL (ref 150–400)
RBC: 4.56 MIL/uL (ref 3.87–5.11)
RDW: 12.2 % (ref 11.5–15.5)
WBC: 6 10*3/uL (ref 4.0–10.5)
nRBC: 0 % (ref 0.0–0.2)

## 2021-10-06 LAB — URINALYSIS, ROUTINE W REFLEX MICROSCOPIC
Bilirubin Urine: NEGATIVE
Glucose, UA: NEGATIVE mg/dL
Ketones, ur: NEGATIVE mg/dL
Nitrite: NEGATIVE
Protein, ur: NEGATIVE mg/dL
Specific Gravity, Urine: 1.01 (ref 1.005–1.030)
pH: 6 (ref 5.0–8.0)

## 2021-10-06 LAB — URINALYSIS, MICROSCOPIC (REFLEX)

## 2021-10-06 MED ORDER — LIDOCAINE 5 % EX PTCH
1.0000 | MEDICATED_PATCH | CUTANEOUS | 0 refills | Status: AC
Start: 2021-10-06 — End: ?

## 2021-10-06 MED ORDER — AMLODIPINE BESYLATE 5 MG PO TABS: 5.0000 mg | ORAL_TABLET | Freq: Every day | ORAL | 0 refills | Status: DC

## 2021-10-06 NOTE — ED Provider Notes (Signed)
[facility] EMERGENCY DEPARTMENT Provider Note   CSN: [id number] Arrival date & time: 10/05/21  2251     History  Chief Complaint  Patient presents with   Back Pain    Michele Webb is a 61 y.o. female.  62 year old female with past medical history of hypertension presents with complaint of mid left back pain onset 4 days ago.  Pain is intermittent, managed with Motrin and Tylenol with relief.  Patient states when she is having pain, the pain is worse if she lays down.  Otherwise denies pain with movement, twisting, lifting.  She denies any recent falls or injuries.  Denies chest pain, shortness of breath, abdominal pain, changes in bowel or bladder habits or any other complaints or concerns.      Home Medications Prior to Admission medications   Medication Sig Start Date End Date Taking? Authorizing Provider  lidocaine (LIDODERM) 5 % Place 1 patch onto the skin daily. Remove & Discard patch within 12 hours or as directed by MD 10/06/21  Yes Jeannie Fend, PA-C  amLODipine (NORVASC) 5 MG tablet Take 1 tablet (5 mg total) by mouth daily. 10/06/21 11/05/21  Jeannie Fend, PA-C  ibuprofen (ADVIL,MOTRIN) 800 MG tablet Take 1 tablet (800 mg total) by mouth every 8 (eight) hours as needed. 07/16/17   Charlsie Quest, MD      Allergies    Benadryl [diphenhydramine hcl], Lisinopril, and Other    Review of Systems   Review of Systems  Constitutional:  Negative for fever.  Respiratory:  Negative for shortness of breath.   Cardiovascular:  Negative for chest pain.  Gastrointestinal:  Negative for abdominal pain.  Genitourinary:  Negative for dysuria.  Musculoskeletal:  Positive for back pain. Negative for arthralgias, gait problem, myalgias, neck pain and neck stiffness.  Skin:  Negative for rash and wound.  Allergic/Immunologic: Negative for immunocompromised state.  Neurological:  Negative for weakness and numbness.  Hematological:  Negative for adenopathy.   Psychiatric/Behavioral:  Negative for confusion.   All other systems reviewed and are negative.  Physical Exam Updated Vital Signs BP (!) 147/102 (BP Location: Right Arm)    Pulse 74    Temp (!) 97.3 F (36.3 C) (Oral)    Resp 17    Ht 5\' 2"  (1.575 m)    Wt 58.5 kg    LMP 03/01/2012    SpO2 97%    BMI 23.59 kg/m  Physical Exam Vitals and nursing note reviewed.  Constitutional:      General: She is not in acute distress.    Appearance: She is well-developed. She is not diaphoretic.  HENT:     Head: Normocephalic and atraumatic.  Cardiovascular:     Rate and Rhythm: Normal rate and regular rhythm.     Heart sounds: Normal heart sounds.  Pulmonary:     Effort: Pulmonary effort is normal.     Breath sounds: Normal breath sounds.  Abdominal:     Palpations: Abdomen is soft.     Tenderness: There is no abdominal tenderness.  Musculoskeletal:        General: No swelling, tenderness, deformity or signs of injury. Normal range of motion.  Skin:    General: Skin is warm and dry.     Findings: No erythema or rash.  Neurological:     Mental Status: She is alert and oriented to person, place, and time.     Sensory: No sensory deficit.     Motor: No weakness.  Gait: Gait normal.     Deep Tendon Reflexes: Reflexes normal.  Psychiatric:        Behavior: Behavior normal.    ED Results / Procedures / Treatments   Labs (all labs ordered are listed, but only abnormal results are displayed) Labs Reviewed  BASIC METABOLIC PANEL - Abnormal; Notable for the following components:      Result Value   Glucose, Bld 103 (*)    All other components within normal limits  URINALYSIS, ROUTINE W REFLEX MICROSCOPIC - Abnormal; Notable for the following components:   Hgb urine dipstick SMALL (*)    Leukocytes,Ua SMALL (*)    All other components within normal limits  URINALYSIS, MICROSCOPIC (REFLEX) - Abnormal; Notable for the following components:   Bacteria, UA RARE (*)    All other  components within normal limits  CBC WITH DIFFERENTIAL/PLATELET    EKG None  Radiology CT Renal Stone Study  Result Date: 10/05/2021 CLINICAL DATA:  Left flank pain pain, kidney stone suspected. EXAM: CT ABDOMEN AND PELVIS WITHOUT CONTRAST TECHNIQUE: Multidetector CT imaging of the abdomen and pelvis was performed following the standard protocol without IV contrast. COMPARISON:  04/21/2015. FINDINGS: Lower chest: Mild atelectasis is noted in the medial aspect of the right middle lobe. The heart is borderline enlarged. Hepatobiliary: Stable scattered cysts are noted in the liver. Stones are present within the gallbladder. No biliary ductal dilatation. Pancreas: Unremarkable. No pancreatic ductal dilatation or surrounding inflammatory changes. Spleen: Normal in size without focal abnormality. Adrenals/Urinary Tract: The adrenal glands are within normal limits. No renal or ureteral calculus or obstructive uropathy is seen bilaterally. The bladder is unremarkable. Stomach/Bowel: The stomach is within normal limits. No bowel obstruction, free air, or pneumatosis. The appendix is not visualized on exam and surgical changes are present at the cecum. No bowel wall thickening. Vascular/Lymphatic: Aortic atherosclerosis. No enlarged abdominal or pelvic lymph nodes. Reproductive: Uterus and bilateral adnexa are unremarkable. Other: No abdominal wall hernia or abnormality. No abdominopelvic ascites. Musculoskeletal: No acute osseous abnormality. IMPRESSION: 1. No renal or ureteral calculus or obstructive uropathy bilaterally. 2. Hepatic cysts. 3. Cholelithiasis. Electronically Signed   By: Thornell SartoriusLaura  Taylor M.D.   On: 10/05/2021 23:46    Procedures Procedures    Medications Ordered in ED Medications - No data to display  ED Course/ Medical Decision Making/ A&P                           Medical Decision Making  This patient presents to the ED for concern of back pain, this involves an extensive number of  treatment options, and is a complaint that carries with it a high risk of complications and morbidity.  The differential diagnosis includes but not limited to musculoskeletal pain, strain, kidney stone.   Co morbidities that complicate the patient evaluation  Hypertension    Additional history obtained:  External records from outside source obtained and reviewed including no recent records, last visit on file from 12/01/18 at resident clinic    Lab Tests:  I Ordered, and personally interpreted labs.  The pertinent results include:  CBC, CMP, UA. No significant lab changes. No urinary symptoms to suggest UTI.    Imaging Studies ordered:  I ordered imaging studies including CT without contrast  I independently visualized and interpreted imaging which showed no kidney stones I agree with the radiologist interpretation, hepatic cysts, cholelithiasis.    Problem List / ED Course:  61 year old female  with left side back pain without fall/injury, not in pain at this time, as above. On exam, no CVA tenderness, no pain with palpation or ROM of the back, abdomen is soft and non tender.    Reevaluation:  After the interventions noted above, I reevaluated the patient and found that they have :stayed the same   Social Determinants of Health:  Reports non PCP, history of HTN and on meds. Will given PCP resources however will also list PCP per prescribed on her Norvasc.   Dispostion:  After consideration of the diagnostic results and the patients response to treatment, I feel that the patent would benefit from refill of her blood pressure medication, Lidoderm patch as needed.  Recheck with PCP if pain persists..          Final Clinical Impression(s) / ED Diagnoses Final diagnoses:  Acute left-sided low back pain without sciatica    Rx / DC Orders ED Discharge Orders          Ordered    lidocaine (LIDODERM) 5 %  Every 24 hours        10/06/21 1405    amLODipine (NORVASC)  5 MG tablet  Daily       Note to Pharmacy: Please consider 90 day supplies to promote better adherence   10/06/21 1410              Jeannie Fend, PA-C 10/06/21 1415    Wynetta Fines, MD 10/07/21 [phone]

## 2021-10-06 NOTE — Discharge Instructions (Addendum)
Continue with Motrin and Tylenol as needed as directed. You can apply Lidoderm patch to your back as needed as prescribed. Take your blood pressure medication as prescribed.  Follow your blood pressure, record readings, take to follow-up with your primary care provider. If you do not have a primary care provider, please see discharge documentation for referral line.

## 2022-02-20 IMAGING — CT CT RENAL STONE PROTOCOL
2 of 4 series · 16 of 46 positions shown, 18 images · non-contrast
Comparison: 04/21/2015.

CLINICAL DATA: Left flank pain pain, kidney stone suspected.

EXAM:
CT ABDOMEN AND PELVIS WITHOUT CONTRAST
TECHNIQUE: Multidetector CT imaging of the abdomen and pelvis was performed
following the standard protocol without IV contrast.

[Series 3: renal stone 5.0 · axial · 0.71mm/px · z∈[+468,+868]mm · 13 of 88 slices shown, 15 images]
[im 4/88  soft-tissue]
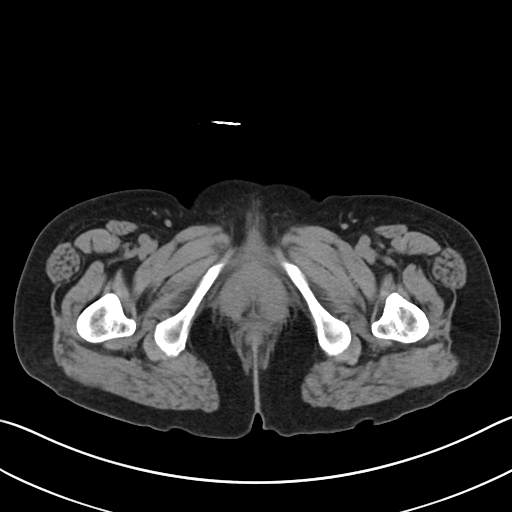
[im 4/88  bone]
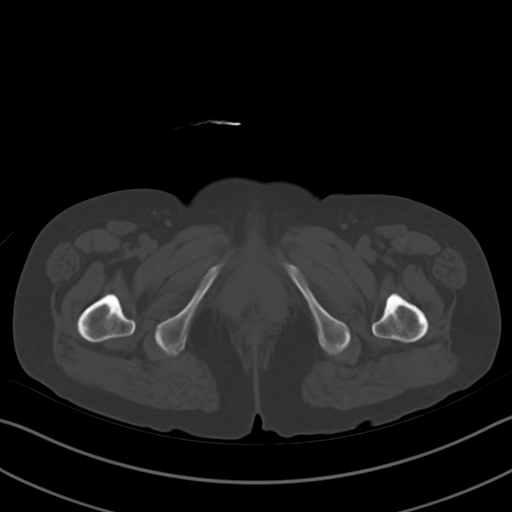
[im 11/88  soft-tissue]
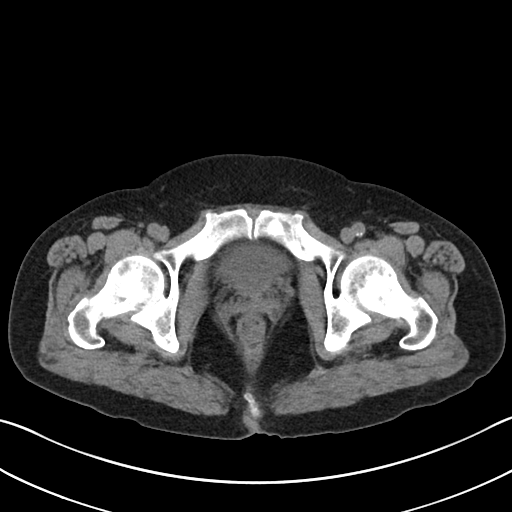
[im 18/88  soft-tissue]
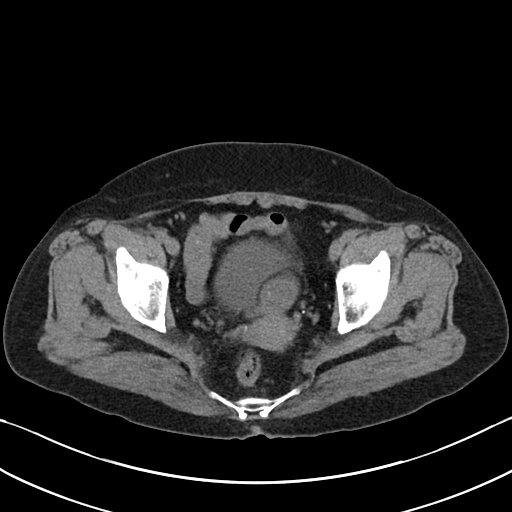
[im 25/88  soft-tissue]
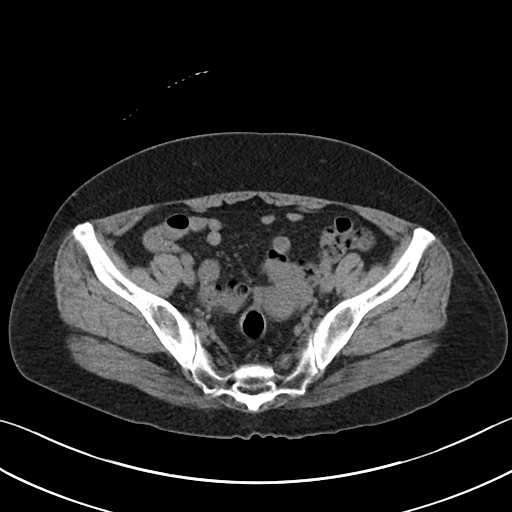
[im 32/88  soft-tissue]
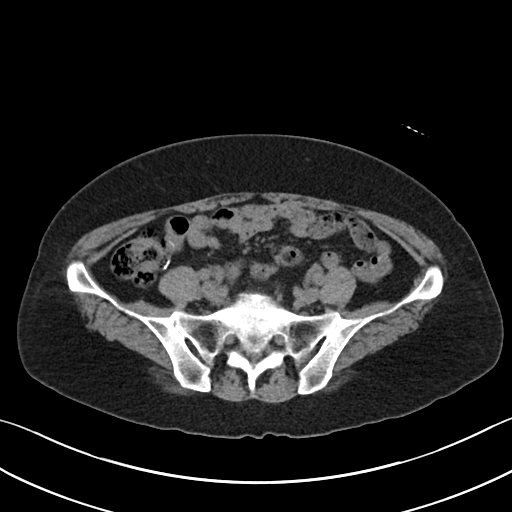
[im 39/88  soft-tissue]
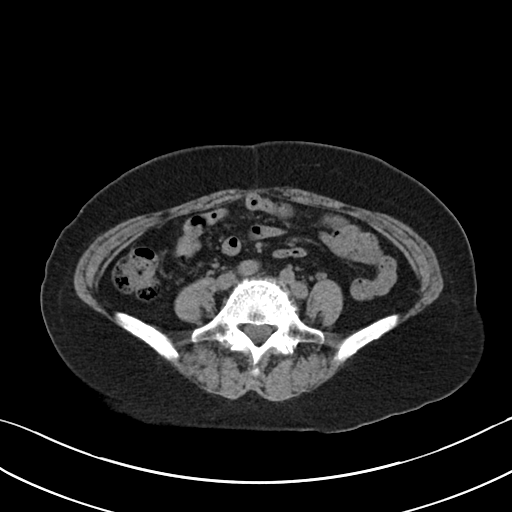
[im 46/88  soft-tissue]
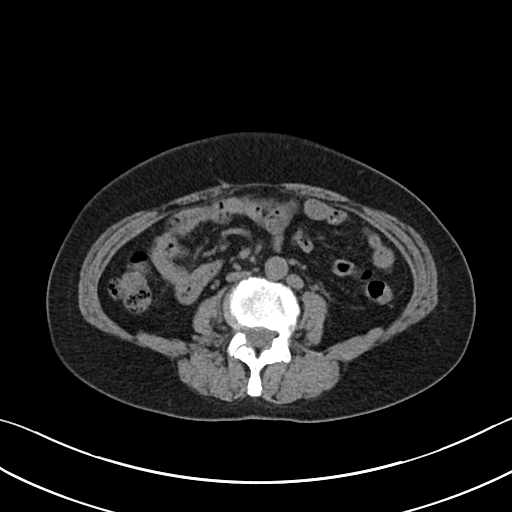
[im 49/88  soft-tissue]
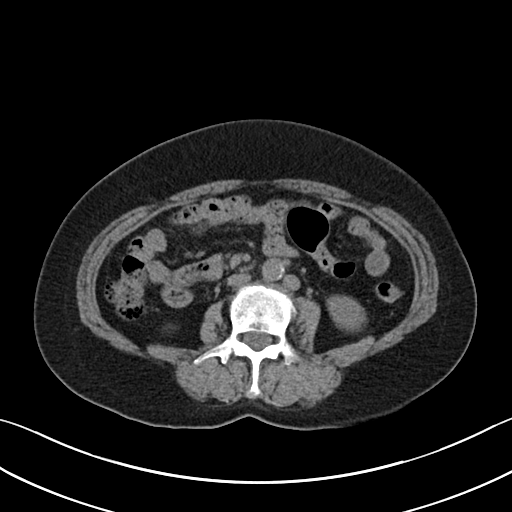
[im 56/88  soft-tissue]
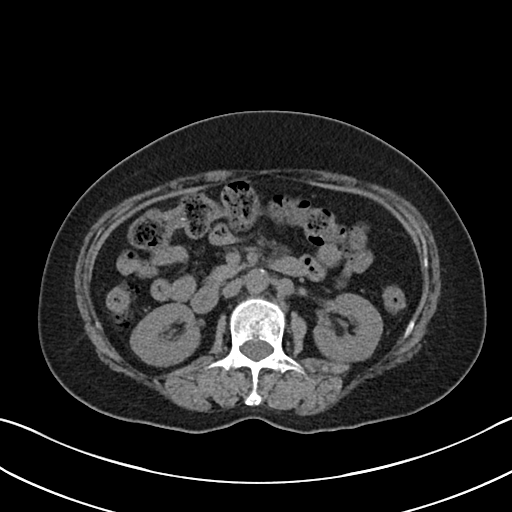
[im 56/88  bone]
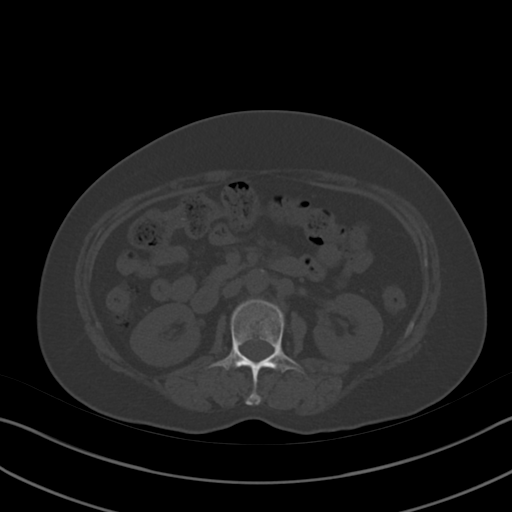
[im 63/88  soft-tissue]
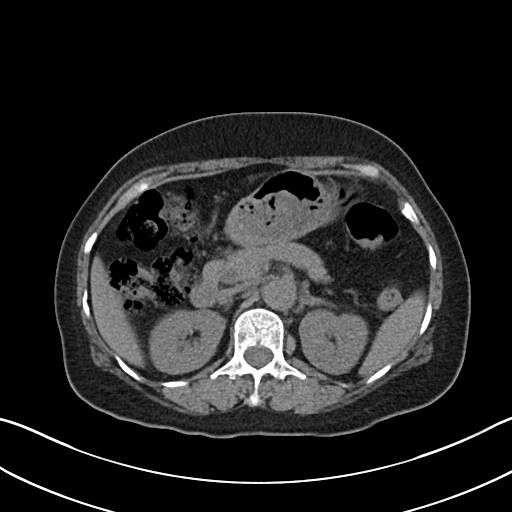
[im 70/88  soft-tissue]
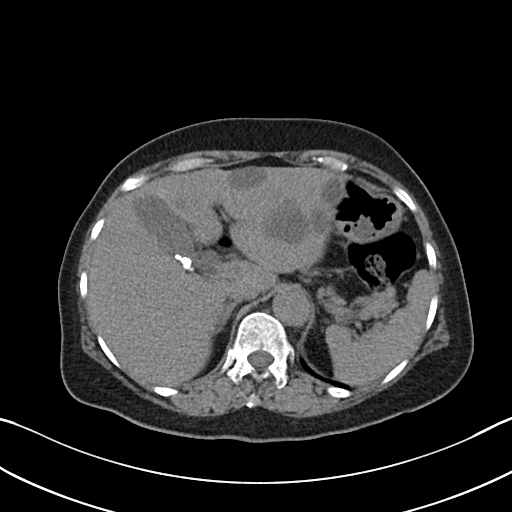
[im 77/88  soft-tissue]
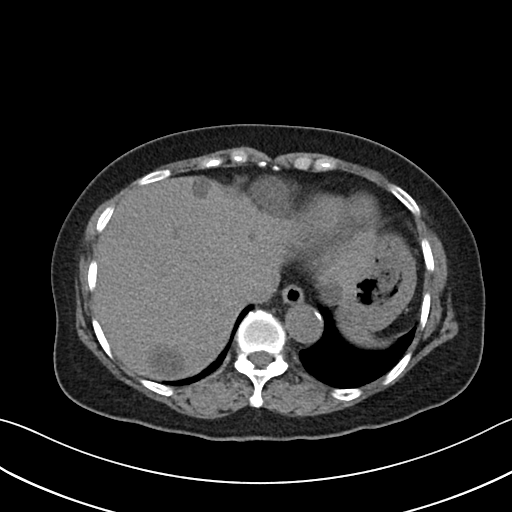
[im 84/88  soft-tissue]
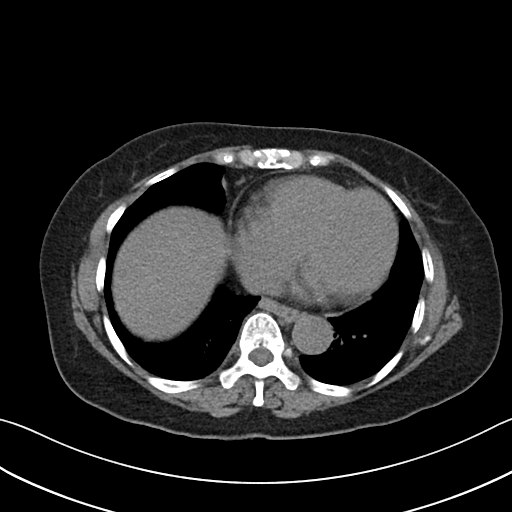

[Series 6: cor · coronal · 0.67mm/px · 3 of 117 slices shown]
[im 39/117  soft-tissue]
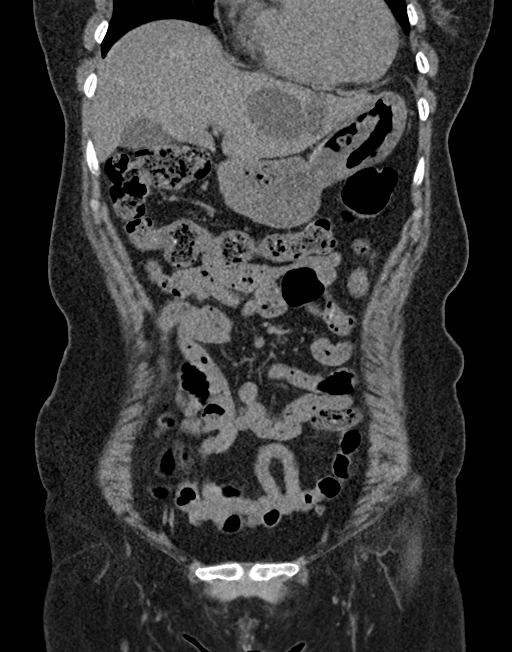
[im 52/117  soft-tissue]
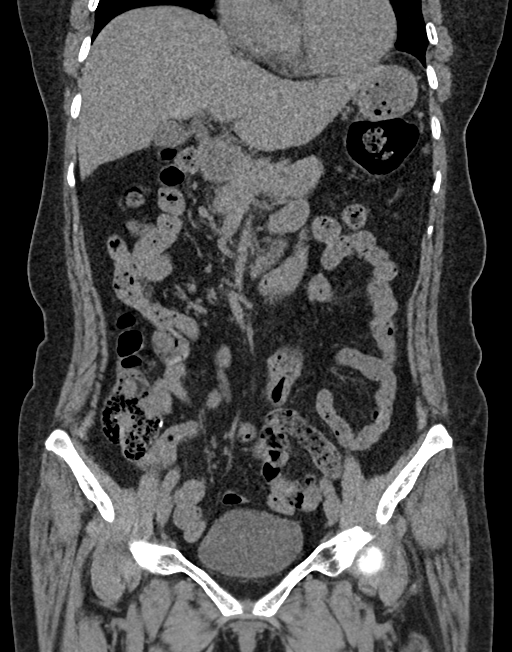
[im 65/117  soft-tissue]
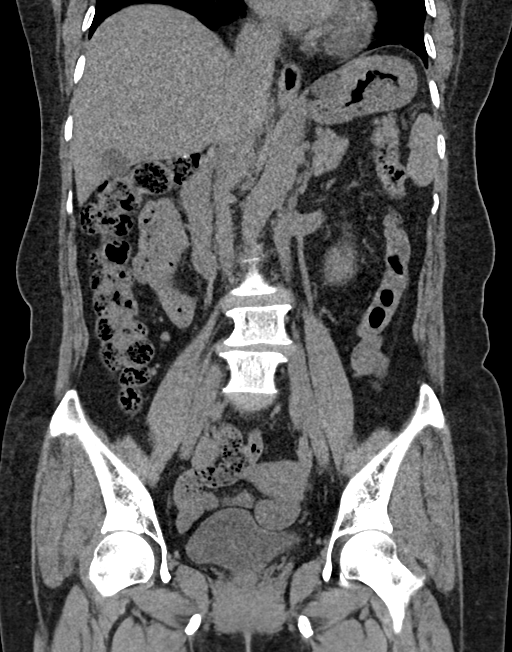

[16 of 46 positions shown; findings below may reference images not displayed]

FINDINGS: Lower chest: Mild atelectasis is noted in the medial aspect of the
right middle lobe. The heart is borderline enlarged.

Hepatobiliary: Stable scattered cysts are noted in the liver. Stones
are present within the gallbladder. No biliary ductal dilatation.

Pancreas: Unremarkable. No pancreatic ductal dilatation or
surrounding inflammatory changes.

Spleen: Normal in size without focal abnormality.

Adrenals/Urinary Tract: The adrenal glands are within normal limits.
No renal or ureteral calculus or obstructive uropathy is seen
bilaterally. The bladder is unremarkable.

Stomach/Bowel: The stomach is within normal limits. No bowel
obstruction, free air, or pneumatosis. The appendix is not
visualized on exam and surgical changes are present at the cecum. No
bowel wall thickening.

Vascular/Lymphatic: Aortic atherosclerosis. No enlarged abdominal or
pelvic lymph nodes.

Reproductive: Uterus and bilateral adnexa are unremarkable.

Other: No abdominal wall hernia or abnormality. No abdominopelvic
ascites.

Musculoskeletal: No acute osseous abnormality.
IMPRESSION: 1. No renal or ureteral calculus or obstructive uropathy
bilaterally.
2. Hepatic cysts.
3. Cholelithiasis.

## 2022-04-13 ENCOUNTER — Other Ambulatory Visit (HOSPITAL_COMMUNITY)
Admission: RE | Admit: 2022-04-13 | Discharge: 2022-04-13 | Disposition: A | Discharge status: Home / Self Care | Payer: 59 | Source: Ambulatory Visit | Attending: Internal Medicine | Admitting: Internal Medicine

## 2022-04-13 ENCOUNTER — Ambulatory Visit (INDEPENDENT_AMBULATORY_CARE_PROVIDER_SITE_OTHER): Payer: 59 | Admitting: Student

## 2022-04-13 VITALS — BP 138/78 | HR 61 | Temp 98.4°F | Ht 62.0 in | Wt 126.2 lb

## 2022-04-13 DIAGNOSIS — Z0000 Encounter for general adult medical examination without abnormal findings: Secondary | ICD-10-CM | POA: Diagnosis present

## 2022-04-13 DIAGNOSIS — I10 Essential (primary) hypertension: Secondary | ICD-10-CM

## 2022-04-13 DIAGNOSIS — R7303 Prediabetes: Secondary | ICD-10-CM | POA: Diagnosis not present

## 2022-04-13 DIAGNOSIS — G8929 Other chronic pain: Secondary | ICD-10-CM

## 2022-04-13 DIAGNOSIS — M545 Low back pain, unspecified: Secondary | ICD-10-CM | POA: Diagnosis not present

## 2022-04-13 DIAGNOSIS — E782 Mixed hyperlipidemia: Secondary | ICD-10-CM | POA: Diagnosis not present

## 2022-04-13 MED ORDER — AMLODIPINE BESYLATE 5 MG PO TABS: 5.0000 mg | ORAL_TABLET | Freq: Every day | ORAL | 0 refills | Status: DC

## 2022-04-13 NOTE — Assessment & Plan Note (Signed)
Patient's last A1c was in 2018.  It was 6.0.  This puts her in the prediabetic range.  She has not had an A1c since.  Given her family history of diabetes in her mother, I will obtain an A1c today to follow her prediabetic stage.  Patient denies any polydipsia, polyuria, or neuropathy at this time.  She states that she checks her blood sugar at home occasionally, and it is ranging from the 90s to 110s.  Plan: - A1c pending -Continue to make lifestyle modifications

## 2022-04-13 NOTE — Assessment & Plan Note (Signed)
Patient presents to the clinic today with elevated blood pressure in the 130s-140s.  Patient denies checking her blood pressure at home.  She states that she has not been taking her amlodipine 5 mg daily because she ran out.  I reorder her amlodipine today.  I informed her to do her blood pressure log and return in 3 months.  Plan: - Continue your amlodipine 5 mg daily - Keep blood pressure log - Follow-up in 3 months for follow-up

## 2022-04-13 NOTE — Assessment & Plan Note (Addendum)
Patient is due for Pap smear, mammogram, colonoscopy, HIV screening, and hepatitis C screening.  Patient states that it has been a long time since she had her Pap smear.  Patient declines her mammogram order and colonoscopy order.  Patient is agreeable to Pap smear, HIV screening and hepatitis C screening.  Plan: - Hepatitis C screening negative - HIV screening negative  - Pap smear completed today, results showing some parakeratosis and benign changes.

## 2022-04-13 NOTE — Patient Instructions (Addendum)
Michele Webb ,Thank you for allowing me to take part in your care today.  Here are your instructions.  1.  I have sent your amlodipine 5 mg to the pharmacy.  Please pick it up.  2.  Please continue to measure your blood pressures at home and maintain a log.  3.  Await the results for your test.  Today you have had a Pap smear, A1c, lipid panel, hepatitis C screening, and HIV screening.  I will call you with the results.  4. For your back pain, please continue to do Advil. Do not take Advil everyday. Take tylenol as well and lidocaine patched. Please only take Advil on very bad days.   5. Please return in 3 months for hypertension and prediabetes.  Thank you, Dr. Allena Katz  If you have any other questions please contact the internal medicine clinic at [phone]

## 2022-04-13 NOTE — Assessment & Plan Note (Signed)
Patient states that she intermittently has back pain to her lower paraspinal region bilaterally.  She states that she has been seen in the emergency department for this back in January 2023.  She states that she has no pain today, and takes Advil and lidocaine patches as needed for this.  She states that she does have good pain control with Advil and lidocaine patches.  I informed her not to take a lot of Advil.  I recommend her to take Tylenol and take ibuprofen on very bad days.  Plan: - No acute back pain today, no need to intervene today. - Continue to do ibuprofen for very severe days.  Take Tylenol as needed for this back pain. - Continue lidocaine patches

## 2022-04-13 NOTE — Progress Notes (Signed)
Internal Medicine Clinic Attending  I saw and evaluated the patient.  I personally confirmed the key portions of the history and exam documented by the resident  and I reviewed pertinent patient test results.  The assessment, diagnosis, and plan were formulated together and I agree with the documentation in the resident's note.  

## 2022-04-13 NOTE — Progress Notes (Addendum)
CC: Hypertension and prediabetes  HPI:  Michele Webb is a 61 y.o. female who presents to the clinic to follow-up for her hypertension and prediabetes.  Patient has not been seen in the clinic for the past 3 years.  Patient states that her only medication is amlodipine.  She states that she has run out of it and requests a refill.  She states that she recently was seen in the emergency room for back pain in January 2023.  Patient was discharged with lidocaine patches.  Patient denies any back pain today.  Patient denies any shortness of breath or chest pain.  Please see assessment and plan for full HPI.  History: Medical: Hypertension Surgical: Appendectomy Family: Mother with hypertension and diabetes Social: Patient denies any tobacco use, alcohol use, or drug use.  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  Patient reports eating rice, soup, vegetables, and stirfries daily.  Patient denies exercising daily.  Past Medical History:  Diagnosis Date   Hypertension      Current Outpatient Medications:    atorvastatin (LIPITOR) 40 MG tablet, Take 1 tablet (40 mg total) by mouth daily., Disp: 90 tablet, Rfl: 3   amLODipine (NORVASC) 5 MG tablet, Take 1 tablet (5 mg total) by mouth daily., Disp: 90 tablet, Rfl: 3   ibuprofen (ADVIL,MOTRIN) 800 MG tablet, Take 1 tablet (800 mg total) by mouth every 8 (eight) hours as needed., Disp: 21 tablet, Rfl: 0   lidocaine (LIDODERM) 5 %, Place 1 patch onto the skin daily. Remove & Discard patch within 12 hours or as directed by MD, Disp: 30 patch, Rfl: 0  Review of Systems:   Respiratory: Patient denies any shortness of breath Cardiovascular: Patient denies any chest pain MSK: Patient denies any back pain at this time  Physical Exam:  Vitals:   04/13/22 0845 04/13/22 0953  BP: (!) 146/90 138/78  Pulse: 61   Temp: 98.4 F (36.9 C)   TempSrc: Oral   SpO2: 99%   Weight: 126 lb 3.2 oz (57.2 kg)   Height: 5\' 2"  (1.575 m)    General:  Alert and orientated x3. Patient is sitting comfortably in the room  Eyes: EOM intact  Head: Normocephalic, atraumatic  Cardio: Regular rate and rhythm, no murmurs, rubs or gallops. 2+ pulses to bilateral upper and lower extremities  Pulmonary: Clear to ausculation bilaterally with no rales, rhonchi, and crackles  Back: No midline tenderness, no step off or deformities noted. No paraspinal muscle tenderness.     Assessment & Plan:   Health care maintenance Patient is due for Pap smear, mammogram, colonoscopy, HIV screening, and hepatitis C screening.  Patient states that it has been a long time since she had her Pap smear.  Patient declines her mammogram order and colonoscopy order.  Patient is agreeable to Pap smear, HIV screening and hepatitis C screening.  Plan: - Hepatitis C screening negative - HIV screening negative  - Pap smear completed today, results showing some parakeratosis and benign changes.   Low back pain Patient states that she intermittently has back pain to her lower paraspinal region bilaterally.  She states that she has been seen in the emergency department for this back in January 2023.  She states that she has no pain today, and takes Advil and lidocaine patches as needed for this.  She states that she does have good pain control with Advil and lidocaine patches.  I informed her not to take a lot of Advil.  I recommend her to take Tylenol and take ibuprofen on very bad days.  Plan: - No acute back pain today, no need to intervene today. - Continue to do ibuprofen for very severe days.  Take Tylenol as needed for this back pain. - Continue lidocaine patches  Pre-diabetes Patient's last A1c was in 2018.  It was 6.0.  This puts her in the prediabetic range.  She has not had an A1c since.  Given her family history of diabetes in her mother, I will obtain an A1c today to follow her prediabetic stage.  Patient denies any polydipsia, polyuria, or neuropathy at this time.   She states that she checks her blood sugar at home occasionally, and it is ranging from the 90s to 110s.  Plan: - A1c 6.2 -Continue to make lifestyle modifications  HTN (hypertension) Patient presents to the clinic today with elevated blood pressure in the 130s-140s.  Patient denies checking her blood pressure at home.  She states that she has not been taking her amlodipine 5 mg daily because she ran out.  I reorder her amlodipine today.  I informed her to do her blood pressure log and return in 3 months.  Plan: - Continue your amlodipine 5 mg daily - Keep blood pressure log - Follow-up in 3 months for follow-up   Mixed hyperlipidemia Patient has had elevated cholesterol numbers in the past.  Patient's most recent cholesterol numbers include a total cholesterol 236.  Patient's LDL 148.  Calculated ASCVD risk of 5.1%.  I recommend the patient to start statin.  Patient is agreeable.  Plan: - Start atorvastatin 40 mg    Patient seen with Dr. Jerline Pain, DO PGY-1 Internal Medicine Resident  Pager: [phone]

## 2022-04-14 ENCOUNTER — Telehealth: Payer: Self-pay | Admitting: Student

## 2022-04-14 LAB — LIPID PANEL
Chol/HDL Ratio: 3.4 ratio (ref 0.0–4.4)
Cholesterol, Total: 236 mg/dL — ABNORMAL HIGH (ref 100–199)
HDL: 69 mg/dL (ref >39)
LDL Chol Calc (NIH): 148 mg/dL — ABNORMAL HIGH (ref 0–99)
Triglycerides: 106 mg/dL (ref 0–149)
VLDL Cholesterol Cal: 19 mg/dL (ref 5–40)

## 2022-04-14 LAB — HEMOGLOBIN A1C
Est. average glucose Bld gHb Est-mCnc: 131 mg/dL
Hgb A1c MFr Bld: 6.2 % — ABNORMAL HIGH (ref 4.8–5.6)

## 2022-04-14 LAB — HIV ANTIBODY (ROUTINE TESTING W REFLEX): HIV Screen 4th Generation wRfx: NONREACTIVE

## 2022-04-14 LAB — HCV INTERPRETATION

## 2022-04-14 LAB — HCV AB W REFLEX TO QUANT PCR: HCV Ab: NONREACTIVE

## 2022-04-14 MED ORDER — AMLODIPINE BESYLATE 5 MG PO TABS: 5.0000 mg | ORAL_TABLET | Freq: Every day | ORAL | 3 refills | Status: AC

## 2022-04-14 MED ORDER — ATORVASTATIN CALCIUM 40 MG PO TABS: 40.0000 mg | ORAL_TABLET | Freq: Every day | ORAL | 3 refills | Status: DC

## 2022-04-14 NOTE — Telephone Encounter (Signed)
I called the patient regarding her lab results.  I informed her that she is still in the prediabetic range for her A1c at 6.2.  Patient had elevated cholesterol 236.  LDL 148.  Her calculated ASCVD is 5.1%.  I recommend her start a moderate intensity statin.  She is agreeable.  I prescribed her 40 mg atorvastatin.

## 2022-04-14 NOTE — Assessment & Plan Note (Signed)
Patient has had elevated cholesterol numbers in the past.  Patient's most recent cholesterol numbers include a total cholesterol 236.  Patient's LDL 148.  Calculated ASCVD risk of 5.1%.  I recommend the patient to start statin.  Patient is agreeable.  Plan: - Start atorvastatin 40 mg

## 2022-04-14 NOTE — Addendum Note (Signed)
Addended by: Modena Slater on: 04/14/2022 04:58 PM   Modules accepted: Orders

## 2022-04-17 ENCOUNTER — Telehealth: Payer: Self-pay | Admitting: Student

## 2022-04-17 LAB — CYTOLOGY - PAP
Comment: NEGATIVE
Diagnosis: REACTIVE
High risk HPV: NEGATIVE

## 2022-04-17 NOTE — Telephone Encounter (Signed)
I spoke to the patient on the phone regarding her other tests results. Her HIV and Hep C screening tests were negative. Her pap did not show any concerning cells. It was also HPV negative.

## 2022-07-20 ENCOUNTER — Ambulatory Visit (INDEPENDENT_AMBULATORY_CARE_PROVIDER_SITE_OTHER): Payer: Commercial Managed Care - HMO | Admitting: Student

## 2022-07-20 ENCOUNTER — Other Ambulatory Visit: Payer: Self-pay

## 2022-07-20 VITALS — BP 130/80 | HR 78 | Temp 98.1°F

## 2022-07-20 DIAGNOSIS — Z789 Other specified health status: Secondary | ICD-10-CM | POA: Diagnosis not present

## 2022-07-20 DIAGNOSIS — E782 Mixed hyperlipidemia: Secondary | ICD-10-CM

## 2022-07-20 MED ORDER — EZETIMIBE 10 MG PO TABS: 10.0000 mg | ORAL_TABLET | Freq: Every day | ORAL | 11 refills | Status: AC

## 2022-07-20 NOTE — Patient Instructions (Signed)
Michele Webb, Doody you for allowing me to take part in your care today.  Here are your instructions.  1.  Please stop taking atorvastatin.  I have started ezetimibe 10 mg daily for you.  Please pick this up from pharmacy.  2.  Regarding your atorvastatin use, I am checking your muscle enzyme today and your liver enzymes today.  I will call you with the results.  3.  Please come back to the clinic in 3 months to follow-up for your prediabetes and hypertension    Thank you, Dr. Posey Pronto  If you have any other questions please contact the internal medicine clinic at [phone]

## 2022-07-20 NOTE — Assessment & Plan Note (Signed)
Patient's most recent ASCVD risk 5.1%.  We will switch away from atorvastatin and moved to ezetimibe.  Plan: -Discontinue atorvastatin -Start ezetimibe 10 mg daily -Checking LFTs

## 2022-07-20 NOTE — Progress Notes (Addendum)
   CC: Myalgias  HPI:  [name] is a 61 y.o. female with a past medical history of hypertension and hyperlipidemia presenting with 3 month history of myalgias after starting atorvastatin.  Please see assessment and plan for full HPI.  Past Medical History:  Diagnosis Date   Hypertension      Current Outpatient Medications:    ezetimibe (ZETIA) 10 MG tablet, Take 1 tablet (10 mg total) by mouth daily., Disp: 30 tablet, Rfl: 11   amLODipine (NORVASC) 5 MG tablet, Take 1 tablet (5 mg total) by mouth daily., Disp: 90 tablet, Rfl: 3   ibuprofen (ADVIL,MOTRIN) 800 MG tablet, Take 1 tablet (800 mg total) by mouth every 8 (eight) hours as needed., Disp: 21 tablet, Rfl: 0   lidocaine (LIDODERM) 5 %, Place 1 patch onto the skin daily. Remove & Discard patch within 12 hours or as directed by MD, Disp: 30 patch, Rfl: 0  Review of Systems:   MSK: Patient endorses back pain, muscle pain, arm pain, and leg pain Physical Exam:  Vitals:   07/20/22 1303  BP: 130/80  Pulse: 78  Temp: 98.1 F (36.7 C)  TempSrc: Oral    General: Patient is sitting comfortably in the room  Head: Normocephalic, atraumatic  Cardio: Regular rate and rhythm, no murmurs, rubs or gallops. 2+ pulses to bilateral upper and lower extremities  Chest: No chest tenderness Pulmonary: Clear to ausculation bilaterally with no rales, rhonchi, and crackles  Skin: No rashes noted  MSK: 5/5 strength to upper and lower extremities.    Assessment & Plan:   Statin intolerance Patient was recently started on atorvastatin 40 mg 3 months ago.  Patient reports that 1 week after starting atorvastatin, she developed myalgias.  She reports having back pain, arm pain, and leg pain.  She states that she tried to see if she could toughen it out, but was not able to do so.  1 week ago, she states that she stopped taking her atorvastatin, and her myalgias resolved.  She notes that she would like to try a different medication for her  hyperlipidemia.  At this time this is a statin intolerance, and will rule out statin induced myopathy with CK.  Plan: -CK pending -Liver panel pending -Discontinue atorvastatin 40 mg -Start ezetimibe 10 mg daily  Addendum: -Liver enzymes within normal limits -CK within normal limits  Mixed hyperlipidemia Patient's most recent ASCVD risk 5.1%.  We will switch away from atorvastatin and moved to ezetimibe.  Plan: -Discontinue atorvastatin -Start ezetimibe 10 mg daily -Checking LFTs   Patient seen with Dr. Glenice Bow, DO PGY-1 Internal Medicine Resident  Pager: [phone]

## 2022-07-20 NOTE — Assessment & Plan Note (Addendum)
Patient was recently started on atorvastatin 40 mg 3 months ago.  Patient reports that 1 week after starting atorvastatin, she developed myalgias.  She reports having back pain, arm pain, and leg pain.  She states that she tried to see if she could toughen it out, but was not able to do so.  1 week ago, she states that she stopped taking her atorvastatin, and her myalgias resolved.  She notes that she would like to try a different medication for her hyperlipidemia.  At this time this is a statin intolerance, and will rule out statin induced myopathy with CK.  Plan: -CK pending -Liver panel pending -Discontinue atorvastatin 40 mg -Start ezetimibe 10 mg daily  Addendum: -Liver enzymes within normal limits -CK within normal limits

## 2022-07-21 ENCOUNTER — Telehealth: Payer: Self-pay | Admitting: Student

## 2022-07-21 LAB — HEPATIC FUNCTION PANEL
ALT: 18 IU/L (ref 0–32)
AST: 20 IU/L (ref 0–40)
Albumin: 4.8 g/dL (ref 3.9–4.9)
Alkaline Phosphatase: 103 IU/L (ref 44–121)
Bilirubin Total: 0.7 mg/dL (ref 0.0–1.2)
Bilirubin, Direct: 0.17 mg/dL (ref 0.00–0.40)
Total Protein: 7.1 g/dL (ref 6.0–8.5)

## 2022-07-21 LAB — CK: Total CK: 74 U/L (ref 32–182)

## 2022-07-21 NOTE — Telephone Encounter (Signed)
Called patient regarding her lab results.  Patient is understanding that her liver function is fine and that her CK was negative.  Patient was elated to hear this.  No changes will be made at this time.

## 2022-07-24 NOTE — Progress Notes (Signed)
Internal Medicine Clinic Attending  Case discussed with Dr. Patel at the time of the visit.  We reviewed the resident's history and exam and pertinent patient test results.  I agree with the assessment, diagnosis, and plan of care documented in the resident's note.  

## 2022-08-28 DIAGNOSIS — Z419 Encounter for procedure for purposes other than remedying health state, unspecified: Secondary | ICD-10-CM | POA: Diagnosis not present

## 2022-09-28 DIAGNOSIS — Z419 Encounter for procedure for purposes other than remedying health state, unspecified: Secondary | ICD-10-CM | POA: Diagnosis not present

## 2022-10-20 ENCOUNTER — Encounter: Payer: Commercial Managed Care - HMO | Admitting: Student

## 2022-10-29 DIAGNOSIS — Z419 Encounter for procedure for purposes other than remedying health state, unspecified: Secondary | ICD-10-CM | POA: Diagnosis not present

## 2022-11-27 DIAGNOSIS — Z419 Encounter for procedure for purposes other than remedying health state, unspecified: Secondary | ICD-10-CM | POA: Diagnosis not present

## 2022-12-28 DIAGNOSIS — Z419 Encounter for procedure for purposes other than remedying health state, unspecified: Secondary | ICD-10-CM | POA: Diagnosis not present

## 2023-01-27 DIAGNOSIS — Z419 Encounter for procedure for purposes other than remedying health state, unspecified: Secondary | ICD-10-CM | POA: Diagnosis not present

## 2023-02-27 DIAGNOSIS — Z419 Encounter for procedure for purposes other than remedying health state, unspecified: Secondary | ICD-10-CM | POA: Diagnosis not present

## 2023-03-29 DIAGNOSIS — Z419 Encounter for procedure for purposes other than remedying health state, unspecified: Secondary | ICD-10-CM | POA: Diagnosis not present

## 2023-04-06 ENCOUNTER — Telehealth: Payer: Self-pay

## 2023-04-06 NOTE — Telephone Encounter (Signed)
Chart review completed for patient. Patient is due for screening mammogram. No answer [name], Population Health Specialist.   

## 2023-04-29 DIAGNOSIS — Z419 Encounter for procedure for purposes other than remedying health state, unspecified: Secondary | ICD-10-CM | POA: Diagnosis not present

## 2023-05-30 DIAGNOSIS — Z419 Encounter for procedure for purposes other than remedying health state, unspecified: Secondary | ICD-10-CM | POA: Diagnosis not present

## 2023-06-29 DIAGNOSIS — Z419 Encounter for procedure for purposes other than remedying health state, unspecified: Secondary | ICD-10-CM | POA: Diagnosis not present

## 2023-07-23 ENCOUNTER — Ambulatory Visit
Admission: EM | Admit: 2023-07-23 | Discharge: 2023-07-23 | Disposition: A | Discharge status: Home / Self Care | Payer: 59 | Attending: Internal Medicine | Admitting: Internal Medicine

## 2023-07-23 ENCOUNTER — Other Ambulatory Visit: Payer: Self-pay

## 2023-07-23 ENCOUNTER — Encounter: Payer: Self-pay | Admitting: Emergency Medicine

## 2023-07-23 DIAGNOSIS — R300 Dysuria: Secondary | ICD-10-CM | POA: Diagnosis not present

## 2023-07-23 DIAGNOSIS — N898 Other specified noninflammatory disorders of vagina: Secondary | ICD-10-CM | POA: Insufficient documentation

## 2023-07-23 LAB — POCT URINALYSIS DIP (MANUAL ENTRY)
Bilirubin, UA: NEGATIVE
Glucose, UA: NEGATIVE mg/dL
Ketones, POC UA: NEGATIVE mg/dL
Leukocytes, UA: NEGATIVE
Nitrite, UA: NEGATIVE
Protein Ur, POC: NEGATIVE mg/dL
Spec Grav, UA: 1.01 (ref 1.010–1.025)
Urobilinogen, UA: 0.2 U/dL
pH, UA: 6.5 (ref 5.0–8.0)

## 2023-07-23 NOTE — ED Triage Notes (Signed)
Pt reports dysuria, urinary frequency x2 days. Pt denies any fever, abd pain.

## 2023-07-23 NOTE — ED Provider Notes (Signed)
[facility]    CSN: [id number] Arrival date & time: 07/23/23  1442      History   Chief Complaint Chief Complaint  Patient presents with   Dysuria    HPI Michele Webb is a 62 y.o. female.   Patient presents to urgent care for evaluation of dysuria, urinary frequency, and vaginal itching that started approximately 1 week ago. Vaginal itching is mild. Dysuria happens almost every time she voids. Denies urinary urgency, urinary hesitancy, incomplete bladder emptying sensation, abdominal pain, vaginal discharge, vaginal odor, dizziness, N/V/D, rash, and fevers/chills. History of prediabetes, does not take SGLT-2 inhibitor. No recent antibiotic/steroid use. Drinks mostly water, denies frequent intake of urinary irritants. Has not attempted use of any OTC medicines PTA.    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 Authorizing Provider  amLODipine (NORVASC) 5 MG tablet Take 1 tablet (5 mg total) by mouth daily. 04/14/22 04/09/23  Modena Slater, DO  ezetimibe (ZETIA) 10 MG tablet Take 1  tablet (10 mg total) by mouth daily. 07/20/22 07/20/23  Modena Slater, DO  ibuprofen (ADVIL,MOTRIN) 800 MG tablet Take 1 tablet (800 mg total) by mouth every 8 (eight) hours as needed. 07/16/17   Charlsie Quest, MD  lidocaine (LIDODERM) 5 % Place 1 patch onto the skin daily. Remove & Discard patch within 12 hours or as directed by MD 10/06/21   Jeannie Fend, PA-C    Family History Family History  Problem Relation Age of Onset   Heart disease Mother    Diabetes Mother    Hypertension Mother    Hyperlipidemia Mother    Early death Father     Social History Social History   Tobacco Use   Smoking status: Never  Substance Use Topics   Alcohol use: No   Drug use: No     Allergies   Benadryl [diphenhydramine hcl], Lisinopril, and Other   Review of Systems Review of Systems  Genitourinary:  Positive for dysuria.  Per HPI   Physical Exam Triage Vital Signs ED Triage Vitals  Encounter Vitals Group     BP 07/23/23 1453 (!) 157/95     Systolic BP Percentile --      Diastolic BP Percentile --      Pulse Rate 07/23/23 1453  70     Resp 07/23/23 1453 20     Temp 07/23/23 1453 97.9 F (36.6 C)     Temp Source 07/23/23 1453 Oral     SpO2 07/23/23 1453 95 %     Weight --      Height --      Head Circumference --      Peak Flow --      Pain Score 07/23/23 1452 0     Pain Loc --      Pain Education --      Exclude from Growth Chart --    No data found.  Updated Vital Signs BP (!) 157/95 (BP Location: Right Arm)   Pulse 70   Temp 97.9 F (36.6 C) (Oral)   Resp 20   LMP 03/01/2012   SpO2 95%   Visual Acuity Right Eye Distance:   Left Eye Distance:   Bilateral Distance:    Right Eye Near:   Left Eye Near:    Bilateral Near:     Physical Exam Vitals and nursing note reviewed.  Constitutional:      Appearance: She is not ill-appearing or toxic-appearing.  HENT:     Head: Normocephalic and atraumatic.     Right Ear: Hearing, tympanic membrane, ear canal and  external ear normal.     Left Ear: Hearing, tympanic membrane, ear canal and external ear normal.     Nose: Nose normal.     Mouth/Throat:     Lips: Pink.  Eyes:     General: Lids are normal. Vision grossly intact. Gaze aligned appropriately.     Extraocular Movements: Extraocular movements intact.     Conjunctiva/sclera: Conjunctivae normal.  Cardiovascular:     Rate and Rhythm: Normal rate and regular rhythm.     Heart sounds: Normal heart sounds, S1 normal and S2 normal.  Pulmonary:     Effort: Pulmonary effort is normal. No respiratory distress.     Breath sounds: Normal breath sounds and air entry. No wheezing, rhonchi or rales.  Chest:     Chest wall: No tenderness.  Abdominal:     General: Bowel sounds are normal.     Palpations: Abdomen is soft.     Tenderness: There is no abdominal tenderness. There is no right CVA tenderness, left CVA tenderness or guarding.  Musculoskeletal:     Cervical back: Neck supple.  Skin:    General: Skin is warm and dry.     Capillary Refill: Capillary refill takes less than 2 seconds.     Findings: No rash.  Neurological:     General: No focal deficit present.     Mental Status: She is alert and oriented to person, place, and time. Mental status is at baseline.     Cranial Nerves: No dysarthria or facial asymmetry.  Psychiatric:        Mood and Affect: Mood normal.        Speech: Speech normal.        Behavior: Behavior normal.        Thought Content: Thought content normal.        Judgment: Judgment normal.      UC Treatments / Results  Labs (all labs ordered are listed, but only abnormal results are displayed) Labs Reviewed  POCT URINALYSIS DIP (MANUAL ENTRY) - Abnormal; Notable for the following components:      Result Value   Color, UA colorless (*)    Blood, UA trace-intact (*)  All other components within normal limits  CERVICOVAGINAL ANCILLARY ONLY    EKG   Radiology No results found.  Procedures Procedures  (including critical care time)  Medications Ordered in UC Medications - No data to display  Initial Impression / Assessment and Plan / UC Course  I have reviewed the triage vital signs and the nursing notes.  Pertinent labs & imaging results that were available during my care of the patient were reviewed by me and considered in my medical decision making (see chart for details).   1. Dysuria, vaginal itching Unclear etiology of symptoms.  Urinalysis unremarkable for signs of UTI.  She appears well hydrated. Suspect possible yeast vaginitis etiology. Vaginal swab pending, no concern for STD, therefore deferred testing. Will treat for any positive infections found on vaginal swab when results come back in 2-3 days, she is agreeable with this.  Counseled patient on potential for adverse effects with medications prescribed/recommended today, strict ER and return-to-clinic precautions discussed, patient verbalized understanding.    Final Clinical Impressions(s) / UC Diagnoses   Final diagnoses:  Dysuria  Vaginal itching     Discharge Instructions      Your urine is negative for signs of UTI.  Continue drinking plenty of water. We checked a vaginal swab to make sure your symptoms aren't coming from a vaginal yeast infection. This swab will come back in the next 2-3 days and staff will call you if the swab is abnormal.  If you develop any new or worsening symptoms or if your symptoms do not start to improve, please return here or follow-up with your primary care provider. If your symptoms are severe, please go to the emergency room.    ED Prescriptions   None    PDMP not reviewed this encounter.   Carlisle Beers, [place] 07/23/23 1524

## 2023-07-23 NOTE — Discharge Instructions (Addendum)
Your urine is negative for signs of UTI.  Continue drinking plenty of water. We checked a vaginal swab to make sure your symptoms aren't coming from a vaginal yeast infection. This swab will come back in the next 2-3 days and staff will call you if the swab is abnormal.  If you develop any new or worsening symptoms or if your symptoms do not start to improve, please return here or follow-up with your primary care provider. If your symptoms are severe, please go to the emergency room.

## 2023-07-26 LAB — CERVICOVAGINAL ANCILLARY ONLY
Bacterial Vaginitis (gardnerella): NEGATIVE
Candida Glabrata: NEGATIVE
Candida Vaginitis: NEGATIVE
Comment: NEGATIVE
Comment: NEGATIVE
Comment: NEGATIVE

## 2023-07-30 DIAGNOSIS — Z419 Encounter for procedure for purposes other than remedying health state, unspecified: Secondary | ICD-10-CM | POA: Diagnosis not present

## 2023-08-29 DIAGNOSIS — Z419 Encounter for procedure for purposes other than remedying health state, unspecified: Secondary | ICD-10-CM | POA: Diagnosis not present

## 2023-09-29 DIAGNOSIS — Z419 Encounter for procedure for purposes other than remedying health state, unspecified: Secondary | ICD-10-CM | POA: Diagnosis not present

## 2023-10-30 DIAGNOSIS — Z419 Encounter for procedure for purposes other than remedying health state, unspecified: Secondary | ICD-10-CM | POA: Diagnosis not present

## 2023-11-27 DIAGNOSIS — Z419 Encounter for procedure for purposes other than remedying health state, unspecified: Secondary | ICD-10-CM | POA: Diagnosis not present

## 2024-01-08 DIAGNOSIS — Z419 Encounter for procedure for purposes other than remedying health state, unspecified: Secondary | ICD-10-CM | POA: Diagnosis not present

## 2024-02-07 DIAGNOSIS — Z419 Encounter for procedure for purposes other than remedying health state, unspecified: Secondary | ICD-10-CM | POA: Diagnosis not present

## 2024-03-09 DIAGNOSIS — Z419 Encounter for procedure for purposes other than remedying health state, unspecified: Secondary | ICD-10-CM | POA: Diagnosis not present

## 2024-04-08 DIAGNOSIS — Z419 Encounter for procedure for purposes other than remedying health state, unspecified: Secondary | ICD-10-CM | POA: Diagnosis not present

## 2024-05-09 DIAGNOSIS — Z419 Encounter for procedure for purposes other than remedying health state, unspecified: Secondary | ICD-10-CM | POA: Diagnosis not present

## 2024-05-21 ENCOUNTER — Encounter (HOSPITAL_COMMUNITY): Payer: Self-pay

## 2024-05-21 ENCOUNTER — Emergency Department (HOSPITAL_COMMUNITY)

## 2024-05-21 ENCOUNTER — Other Ambulatory Visit: Payer: Self-pay

## 2024-05-21 ENCOUNTER — Emergency Department (HOSPITAL_COMMUNITY)
Admission: EM | Admit: 2024-05-21 | Discharge: 2024-05-22 | Disposition: A | Discharge status: Home / Self Care | Attending: Emergency Medicine | Admitting: Emergency Medicine

## 2024-05-21 DIAGNOSIS — R079 Chest pain, unspecified: Secondary | ICD-10-CM | POA: Insufficient documentation

## 2024-05-21 DIAGNOSIS — R0789 Other chest pain: Secondary | ICD-10-CM | POA: Diagnosis not present

## 2024-05-21 DIAGNOSIS — I10 Essential (primary) hypertension: Secondary | ICD-10-CM | POA: Insufficient documentation

## 2024-05-21 LAB — CBC
HCT: 42 % (ref 36.0–46.0)
Hemoglobin: 13.7 g/dL (ref 12.0–15.0)
MCH: 30.1 pg (ref 26.0–34.0)
MCHC: 32.6 g/dL (ref 30.0–36.0)
MCV: 92.3 fL (ref 80.0–100.0)
Platelets: 251 K/uL (ref 150–400)
RBC: 4.55 MIL/uL (ref 3.87–5.11)
RDW: 12.5 % (ref 11.5–15.5)
WBC: 5.7 K/uL (ref 4.0–10.5)
nRBC: 0 % (ref 0.0–0.2)

## 2024-05-21 LAB — BASIC METABOLIC PANEL WITH GFR
Anion gap: 11 (ref 5–15)
BUN: 11 mg/dL (ref 8–23)
CO2: 22 mmol/L (ref 22–32)
Calcium: 9.6 mg/dL (ref 8.9–10.3)
Chloride: 107 mmol/L (ref 98–111)
Creatinine, Ser: 0.56 mg/dL (ref 0.44–1.00)
GFR, Estimated: >60 mL/min (ref >60)
Glucose, Bld: 102 mg/dL — ABNORMAL HIGH (ref 70–99)
Potassium: 3.6 mmol/L (ref 3.5–5.1)
Sodium: 140 mmol/L (ref 135–145)

## 2024-05-21 LAB — TROPONIN I (HIGH SENSITIVITY): Troponin I (High Sensitivity): 5 ng/L (ref <18)

## 2024-05-21 NOTE — ED Triage Notes (Signed)
 Pt reports having chest pain on her left side that is radiating to the back. Pt reports that she has had this pain on and off . Pt reports that this pain started around 7pm

## 2024-05-22 DIAGNOSIS — R079 Chest pain, unspecified: Secondary | ICD-10-CM | POA: Diagnosis not present

## 2024-05-22 LAB — TROPONIN I (HIGH SENSITIVITY): Troponin I (High Sensitivity): 5 ng/L (ref <18)

## 2024-05-22 MED ORDER — IBUPROFEN 200 MG PO TABS
600.0000 mg | ORAL_TABLET | Freq: Once | ORAL | Status: AC
  Administered 2024-05-22: 600 mg via ORAL
  Filled 2024-05-22: qty 1

## 2024-05-22 MED ORDER — FAMOTIDINE 20 MG PO TABS
20.0000 mg | ORAL_TABLET | Freq: Once | ORAL | Status: AC
  Administered 2024-05-22: 20 mg via ORAL
  Filled 2024-05-22: qty 1

## 2024-05-22 MED ORDER — KETOROLAC TROMETHAMINE 15 MG/ML IJ SOLN: 15.0000 mg | Freq: Once | INTRAMUSCULAR | Status: DC

## 2024-05-22 NOTE — ED Provider Notes (Signed)
 [place] [facility] Provider Note   CSN: [id number] Arrival date & time: 05/21/24  2037     History Chief Complaint  Patient presents with   Chest Pain    HPI Michele Webb is a 63 y.o. female presenting for chief complaint of chest pain, intermittent in nature. Stabbing through to left shoulder. Endorses a history of similar 2/2 MSK pain 20 years ago with a negative cardiac workup. No medical problems or medications.   Patient's recorded medical, surgical, social, medication list and allergies were reviewed in the Snapshot window as part of the initial history.   Review of Systems   Review of Systems  Constitutional:  Negative for chills and fever.  HENT:  Negative for ear pain and sore throat.   Eyes:  Negative for pain and visual disturbance.  Respiratory:  Positive for chest tightness. Negative for cough and shortness of breath.   Cardiovascular:  Positive for chest pain. Negative for palpitations.  Gastrointestinal:  Negative for abdominal pain and vomiting.  Genitourinary:  Negative for dysuria and hematuria.  Musculoskeletal:  Negative for arthralgias and back pain.  Skin:  Negative for color change and rash.  Neurological:  Negative for seizures and syncope.  All other systems reviewed and are negative.   Physical Exam Updated Vital Signs BP 126/86   Pulse (!) 54   Temp 98 F (36.7 C)   Resp 18   Ht 5' 2 (1.575 m)   Wt 59 kg   LMP 03/01/2012   SpO2 96%   BMI 23.78 kg/m  Physical Exam Vitals and nursing note reviewed.  Constitutional:      General: She is not in acute distress.    Appearance: She is well-developed.  HENT:     Head: Normocephalic and atraumatic.  Eyes:     Conjunctiva/sclera: Conjunctivae normal.  Cardiovascular:     Rate and Rhythm: Normal rate and regular rhythm.     Heart sounds: No murmur heard. Pulmonary:     Effort: Pulmonary effort is normal. No respiratory distress.     Breath sounds:  Normal breath sounds.  Abdominal:     General: There is no distension.     Palpations: Abdomen is soft.     Tenderness: There is no abdominal tenderness. There is no right CVA tenderness or left CVA tenderness.  Musculoskeletal:        General: No swelling or tenderness. Normal range of motion.     Cervical back: Neck supple.  Skin:    General: Skin is warm and dry.  Neurological:     General: No focal deficit present.     Mental Status: She is alert and oriented to person, place, and time. Mental status is at baseline.     Cranial Nerves: No cranial nerve deficit.      ED Course/ Medical Decision Making/ A&P    Procedures Procedures   Medications Ordered in ED Medications  famotidine  (PEPCID ) tablet 20 mg (20 mg Oral Given 05/22/24 0045)  ibuprofen  (ADVIL ) tablet 600 mg (600 mg Oral Given 05/22/24 0045)   Medical Decision Making: Michele Webb is a 63 y.o. female who presented to the ED today with chest pain, detailed above.  Based on patient's comorbidities, patient has a heart score of 2.    Additional history discussed with patient's family/caregivers.  Patient placed on continuous vitals and telemetry monitoring while in ED which was reviewed periodically.  Complete initial physical exam performed, notably the patient was HDS  in NAD.   Reviewed and confirmed nursing documentation for past medical history, family history, social history.    Initial Assessment: With the patient's presentation of left-sided chest pain, most likely diagnosis is musculoskeletal chest pain versus GERD, although ACS remains on the differential. Other diagnoses were considered including (but not limited to) pulmonary embolism, community-acquired pneumonia, aortic dissection, pneumothorax, underlying bony abnormality, anemia. These are considered less likely due to history of present illness and physical exam findings.     Aortic Dissection also reconsidered but seems less likely based on the location,  quality, onset, and severity of symptoms in this case. Patient has a lack of serious comorbidities for this condition including a lack of HTN or Smoking. Patient also has a lack of underlying history of AD or TAA.  This is most consistent with an acute life/limb threatening illness complicated by underlying chronic conditions.   Initial Plan: EKG and serial troponin to evaluate for cardiac pathology. Evaluate for dissection, bony abnormality, or pneumonia with chest x-ray and screening laboratory evaluation including CBC, BMP  Further evaluation for pulmonary embolism not indicated at this time based on patient's [place] 0 and Wells  score. Further evaluation for Thoracic Aortic Dissection not indicated at this time based on patient's clinical history and PE findings.   Initial Study Results: EKG was reviewed independently. Rate, rhythm, axis, intervals all examined and without medically relevant abnormality. ST segments without concerns for elevations.    Laboratory  Delta troponin demonstrated NAA  CBC and BMP without obvious metabolic or inflammatory abnormalities requiring further evaluation   Radiology  DG Chest 2 View Result Date: 05/21/2024 CLINICAL DATA:  Chest pain for 2 days EXAM: CHEST - 2 VIEW COMPARISON:  None FINDINGS: The heart size and mediastinal contours are within normal limits. Both lungs are clear. The visualized skeletal structures are unremarkable. IMPRESSION: No active cardiopulmonary disease. Electronically Signed   By: Oneil Devonshire M.D.   On: 05/21/2024 22:33    Final Assessment and Plan: On repeat clinical assessment, patient has had complete symptomatic resolution after administration of Pepcid  and Ibuprofen .  They are currently ambulatory tolerating p.o. intake.  They deny fevers or chills, nausea vomiting, syncope shortness of breath.  Favor likely musculoskeletal versus gastroesophageal etiology of patient's symptoms.  Considered ACS grossly less likely given  resolution, well appearance and negative troponin and low HEART score.  Patient to follow-up with primary care provider within 48 hours for ongoing care and management.  Clinical Impression:  1. Hypertension, unspecified type   2. Chest pain, unspecified type      Discharge   Final Clinical Impression(s) / ED Diagnoses Final diagnoses:  Hypertension, unspecified type  Chest pain, unspecified type    Rx / DC Orders ED Discharge Orders          Ordered    Ambulatory referral to Cardiology        05/22/24 0029              Jerral Meth, MD 05/22/24 [phone]

## 2024-05-22 NOTE — ED Notes (Signed)
Patient verbalizes understanding of discharge instructions. Opportunity for questioning and answers were provided. Armband removed by staff, pt discharged from ED. Ambulated out to lobby with husband

## 2024-06-09 DIAGNOSIS — Z419 Encounter for procedure for purposes other than remedying health state, unspecified: Secondary | ICD-10-CM | POA: Diagnosis not present
